# Patient Record
Sex: Male | Born: 1950 | Race: White | Hispanic: No | Marital: Married | Smoking: Never smoker
Health system: Southern US, Community
[De-identification: ages and names within clinical notes are randomized; demographics above are authoritative.]

## PROBLEM LIST (undated history)

## (undated) ENCOUNTER — Emergency Department: Discharge status: Absent without leave | Payer: Medicare Other

## (undated) DIAGNOSIS — M1990 Unspecified osteoarthritis, unspecified site: Secondary | ICD-10-CM

## (undated) DIAGNOSIS — I252 Old myocardial infarction: Secondary | ICD-10-CM

## (undated) DIAGNOSIS — I8501 Esophageal varices with bleeding: Secondary | ICD-10-CM

## (undated) DIAGNOSIS — E785 Hyperlipidemia, unspecified: Secondary | ICD-10-CM

## (undated) DIAGNOSIS — K2961 Other gastritis with bleeding: Secondary | ICD-10-CM

## (undated) DIAGNOSIS — I10 Essential (primary) hypertension: Secondary | ICD-10-CM

## (undated) DIAGNOSIS — G4730 Sleep apnea, unspecified: Secondary | ICD-10-CM

## (undated) DIAGNOSIS — S82841A Displaced bimalleolar fracture of right lower leg, initial encounter for closed fracture: Secondary | ICD-10-CM

## (undated) HISTORY — DX: Displaced bimalleolar fracture of right lower leg, initial encounter for closed fracture: S82.841A

## (undated) HISTORY — PX: JOINT REPLACEMENT: SHX530

## (undated) HISTORY — PX: CATARACT EXTRACTION: SUR2

---

## 2010-05-05 ENCOUNTER — Emergency Department (HOSPITAL_COMMUNITY): Payer: Managed Care, Other (non HMO)

## 2010-05-05 ENCOUNTER — Other Ambulatory Visit (HOSPITAL_COMMUNITY): Payer: Managed Care, Other (non HMO)

## 2010-05-05 ENCOUNTER — Inpatient Hospital Stay (HOSPITAL_COMMUNITY): Payer: Managed Care, Other (non HMO)

## 2010-05-05 ENCOUNTER — Inpatient Hospital Stay (HOSPITAL_COMMUNITY)
Admission: EM | Admit: 2010-05-05 | Discharge: 2010-05-06 | DRG: 947 | Disposition: A | Discharge status: Home / Self Care | Payer: Managed Care, Other (non HMO) | Attending: Internal Medicine | Admitting: Internal Medicine

## 2010-05-05 ENCOUNTER — Emergency Department (HOSPITAL_COMMUNITY): Payer: Self-pay

## 2010-05-05 DIAGNOSIS — G9340 Encephalopathy, unspecified: Secondary | ICD-10-CM | POA: Diagnosis present

## 2010-05-05 DIAGNOSIS — I2510 Atherosclerotic heart disease of native coronary artery without angina pectoris: Secondary | ICD-10-CM | POA: Diagnosis present

## 2010-05-05 DIAGNOSIS — M79609 Pain in unspecified limb: Secondary | ICD-10-CM

## 2010-05-05 DIAGNOSIS — M069 Rheumatoid arthritis, unspecified: Secondary | ICD-10-CM | POA: Diagnosis present

## 2010-05-05 DIAGNOSIS — E785 Hyperlipidemia, unspecified: Secondary | ICD-10-CM | POA: Diagnosis present

## 2010-05-05 DIAGNOSIS — F29 Unspecified psychosis not due to a substance or known physiological condition: Secondary | ICD-10-CM | POA: Diagnosis present

## 2010-05-05 DIAGNOSIS — E872 Acidosis, unspecified: Secondary | ICD-10-CM | POA: Diagnosis present

## 2010-05-05 DIAGNOSIS — I509 Heart failure, unspecified: Secondary | ICD-10-CM | POA: Diagnosis present

## 2010-05-05 DIAGNOSIS — Z96659 Presence of unspecified artificial knee joint: Secondary | ICD-10-CM

## 2010-05-05 DIAGNOSIS — E119 Type 2 diabetes mellitus without complications: Secondary | ICD-10-CM | POA: Diagnosis present

## 2010-05-05 DIAGNOSIS — I10 Essential (primary) hypertension: Secondary | ICD-10-CM | POA: Diagnosis present

## 2010-05-05 DIAGNOSIS — F329 Major depressive disorder, single episode, unspecified: Secondary | ICD-10-CM | POA: Diagnosis present

## 2010-05-05 DIAGNOSIS — K7460 Unspecified cirrhosis of liver: Secondary | ICD-10-CM | POA: Diagnosis present

## 2010-05-05 DIAGNOSIS — M545 Low back pain, unspecified: Secondary | ICD-10-CM | POA: Diagnosis present

## 2010-05-05 DIAGNOSIS — I252 Old myocardial infarction: Secondary | ICD-10-CM

## 2010-05-05 DIAGNOSIS — F3289 Other specified depressive episodes: Secondary | ICD-10-CM | POA: Diagnosis present

## 2010-05-05 DIAGNOSIS — R4182 Altered mental status, unspecified: Principal | ICD-10-CM | POA: Diagnosis present

## 2010-05-05 LAB — POCT I-STAT 3, ART BLOOD GAS (G3+)
pCO2 arterial: 62.6 mmHg (ref 35.0–45.0)
pH, Arterial: 7.303 — ABNORMAL LOW (ref 7.350–7.450)

## 2010-05-05 LAB — CARDIAC PANEL(CRET KIN+CKTOT+MB+TROPI)
CK, MB: 4.9 ng/mL — ABNORMAL HIGH (ref 0.3–4.0)
CK, MB: 2.7 ng/mL (ref 0.3–4.0)
Relative Index: INVALID (ref 0.0–2.5)
Total CK: 108 U/L (ref 7–232)
Total CK: 82 U/L (ref 7–232)
Troponin I: 0.01 ng/mL (ref 0.00–0.06)

## 2010-05-05 LAB — URINALYSIS, ROUTINE W REFLEX MICROSCOPIC
Hgb urine dipstick: NEGATIVE
Nitrite: NEGATIVE
Specific Gravity, Urine: 1.018 (ref 1.005–1.030)
Urobilinogen, UA: 1 mg/dL (ref 0.0–1.0)

## 2010-05-05 LAB — GLUCOSE, CAPILLARY
Glucose-Capillary: 122 mg/dL — ABNORMAL HIGH (ref 70–99)
Glucose-Capillary: 126 mg/dL — ABNORMAL HIGH (ref 70–99)
Glucose-Capillary: 138 mg/dL — ABNORMAL HIGH (ref 70–99)
Glucose-Capillary: 129 mg/dL — ABNORMAL HIGH (ref 70–99)
Glucose-Capillary: 203 mg/dL — ABNORMAL HIGH (ref 70–99)

## 2010-05-05 LAB — ETHANOL: Alcohol, Ethyl (B): <5 mg/dL (ref 0–10)

## 2010-05-05 LAB — COMPREHENSIVE METABOLIC PANEL
ALT: 39 U/L (ref 0–53)
AST: 45 U/L — ABNORMAL HIGH (ref 0–37)
Alkaline Phosphatase: 126 U/L — ABNORMAL HIGH (ref 39–117)
CO2: 25 mEq/L (ref 19–32)
Glucose, Bld: 131 mg/dL — ABNORMAL HIGH (ref 70–99)
Potassium: 3.4 mEq/L — ABNORMAL LOW (ref 3.5–5.1)
Sodium: 140 mEq/L (ref 135–145)
Total Protein: 6.5 g/dL (ref 6.0–8.3)

## 2010-05-05 LAB — IRON AND TIBC
Iron: 49 ug/dL (ref 42–135)
Saturation Ratios: 17 % — ABNORMAL LOW (ref 20–55)
TIBC: 290 ug/dL (ref 215–435)
UIBC: 241 ug/dL

## 2010-05-05 LAB — CBC
MCHC: 31.5 g/dL (ref 30.0–36.0)
RDW: 16.5 % — ABNORMAL HIGH (ref 11.5–15.5)

## 2010-05-05 LAB — LACTIC ACID, PLASMA: Lactic Acid, Venous: 2.3 mmol/L — ABNORMAL HIGH (ref 0.5–2.2)

## 2010-05-05 LAB — BRAIN NATRIURETIC PEPTIDE: Pro B Natriuretic peptide (BNP): 81 pg/mL (ref 0.0–100.0)

## 2010-05-05 LAB — RAPID URINE DRUG SCREEN, HOSP PERFORMED
Barbiturates: NOT DETECTED
Opiates: NOT DETECTED
Tetrahydrocannabinol: NOT DETECTED

## 2010-05-05 LAB — TSH: TSH: 0.8 u[IU]/mL (ref 0.350–4.500)

## 2010-05-06 ENCOUNTER — Inpatient Hospital Stay (HOSPITAL_COMMUNITY): Payer: Managed Care, Other (non HMO)

## 2010-05-06 ENCOUNTER — Other Ambulatory Visit (HOSPITAL_COMMUNITY): Payer: Managed Care, Other (non HMO)

## 2010-05-06 LAB — COMPREHENSIVE METABOLIC PANEL
BUN: 12 mg/dL (ref 6–23)
CO2: 28 mEq/L (ref 19–32)
Calcium: 8.9 mg/dL (ref 8.4–10.5)
Creatinine, Ser: 0.8 mg/dL (ref 0.4–1.5)
GFR calc non Af Amer: >60 mL/min (ref >60)
Glucose, Bld: 132 mg/dL — ABNORMAL HIGH (ref 70–99)
Total Bilirubin: 0.7 mg/dL (ref 0.3–1.2)

## 2010-05-06 LAB — CBC
MCH: 28.6 pg (ref 26.0–34.0)
MCHC: 31.7 g/dL (ref 30.0–36.0)
MCV: 90.2 fL (ref 78.0–100.0)
Platelets: 145 10*3/uL — ABNORMAL LOW (ref 150–400)
RDW: 16.8 % — ABNORMAL HIGH (ref 11.5–15.5)
WBC: 7.9 10*3/uL (ref 4.0–10.5)

## 2010-05-06 LAB — AMMONIA: Ammonia: 70 umol/L — ABNORMAL HIGH (ref 11–35)

## 2010-05-06 LAB — MAGNESIUM: Magnesium: 2 mg/dL (ref 1.5–2.5)

## 2010-05-06 LAB — GLUCOSE, CAPILLARY: Glucose-Capillary: 138 mg/dL — ABNORMAL HIGH (ref 70–99)

## 2010-05-07 NOTE — Discharge Summary (Signed)
NAMESAVIAN, Nicholas Petersen               ACCOUNT NO.:  [id number]  MEDICAL RECORD NO.:  [id number]           PATIENT TYPE:  I  LOCATION:  2108                         FACILITY:  [facility]  PHYSICIAN:  Nicholas Petersen, M.D.  DATE OF BIRTH:  11-14-1950  DATE OF ADMISSION:  05/05/2010 DATE OF DISCHARGE:  05/06/2010                              DISCHARGE SUMMARY   PRIMARY CARE PROVIDER:  [facility].  DISCHARGE DIAGNOSES: 1. Altered mental status/hallucinosis secondary to medication side     effect. 2. Fall, secondary to altered mental status/hallucinosis.  No bony     injuries. 3. Mild congestive heart failure. 4. History of non alcoholic steatohepatitis/cirrhosis. 5. Type 2 diabetes mellitus. 6. Dyslipidemia. 7. Hypertension. 8. History of coronary artery disease status post myocardial     infarction. 9. Chronic low back pain. 10.Rheumatoid arthritis. 11.Status post recent cataract surgery, right eye. 12.Mild hyperammonemia.  DISCHARGE MEDICATIONS: 1. Bromfenac ophthalmic solution 0.09% one drop right eye daily. 2. Ciprofloxacin 0.3% ophthalmic solution 1 drop right eye q.i.d. 3. Lactulose (10 g/50 mL) 30 mL p.o. b.i.d. 4. Prednisolone 1% ophthalmic solution (Pred Forte) 1 drop right eye     q.i.d. 5. Lasix 40 mg p.o. b.i.d. (40 mg p.o. daily). 6. Atenolol 100 mg p.o. daily. 7. Cymbalta 60 mg p.o. daily. 8. Enbrel (50 mg/mL) 1 mL subcutaneously weekly. 9. Flonase nasal spray 2 sprays each nostril q.p.m. 10.Hydrocodone/APAP (7.5/325) one p.o. t.i.d. 11.Integra Plus 1 capsule p.o. daily. 12.Lipitor 40 mg p.o. at bedtime. 13.Lisinopril/hydrochlorothiazide (20/12.5) 2 tablets p.o. q.a.m. 14.Lovaza 2 g p.o. b.i.d. 15.Lyrica 300 mg p.o. daily p.r.n. for pain. 16.Melatonin 10 mg p.o. p.r.n. at bedtime for insomnia. 17.Metformin 500 mg p.o. b.i.d. 18.Nuvigil 250 mg p.o. daily. 19.Protonix 40 mg p.o. daily. 20.Potassium chloride 20 mEq p.o. daily. 21.Prednisone 10 mg p.o.  daily. 22.Trilipix 135 mg p.o. daily. 23.Zyrtec 10 mg p.o. daily.  PROCEDURES: 1. Chest x-ray May 05, 2010.  This showed appearance most     compatible with moderate CHF with low lung volumes. 2. Cervical CT scan, May 05, 2010.  This showed no acute cervical     spinal fracture subluxation or dislocation, completed C5-C7 ACDF     fusion, adjacent segment disease at C4-C5. 3. Head CT scan, May 05, 2010.  This showed no acute intracranial     abnormality.  There was right-sided watershed encephalomalacia,     findings compatible with recent right cataract surgery. 4. Brain MRI, May 06, 2010.  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  No sign of acute or reversible process.  The     remainder of the brain appears normal for age. 5. EEG done May 05, 2010.  Preliminary report indicates slowing     but no focal epileptiform changes. 6. 2-D echocardiogram May 05, 2010.  This showed normal     ventricular cavity size, systolic function was mildly to moderately     reduced.  Estimated ejection fraction 40% to 45%.  Regional wall     motion was normal. 7. Bilateral  lower extremity venous Dopplers May 05, 2010.  No     evidence of DVT, superficial thrombophlebitis or Baker cyst.  CONSULTATIONS:  None.  ADMISSION HISTORY:  As in H and P notes of May 05, 2010, dictated by Dr. Midge Minium.  However, in brief, this is a 60 year old male, with known history of type 2 diabetes mellitus, chronic low back pain, DJD, depression, status post recent cataract surgery, history of nonalcoholic steatohepatitis complicated by liver cirrhosis, dyslipidemia, coronary artery disease status post previous MI approximately 20 years ago, status post knee replacements, post thyroid surgery, presenting with altered mental status described as confusion and hallucinosis.  Apparently, he went to work on  May 04, 2010, and then after his wife picked him up from work, he became progressively more confused. When he went to the bathroom, he fell in the bath tub.  EMS was then called.  The patient was evaluated in the emergency department, given several doses of Narcan with minimal response, then referred to the medical service for admission further evaluation, investigation and management.  CLINICAL COURSE: 1. Altered mental status.  Etiology appears to be multifactorial.     However, main issues appeared to pain medication side effect     occasioned by benzodiazepine as well as mild hypoammonemia and     congestive heart failure.  The patient had a urine drug screen,     which revealed benzodiazepines.  This was reversed with flumazenil,      with satisfactory clinical response and resolution of confusion     There was no recurrence of hallucinations, during the course of     the patient's inpatient observation.  Urinalysis was normal.  Chest     x-ray showed no evidence of infection.  The patient had no pyrexia     during the course of his this hospitalization.  White cell count     was within normal limits.  As of May 06, 2010, the patient was     fully oriented and mental status appeared to be at baseline.  2. History of fall.  Fortunately, the patient had no bony injuries.     He did have cervical and head CT scan, which were devoid of acute     abnormalities.  3. Type 2 diabetes mellitus.  The patient remained euglycemic     throughout the course of his hospitalization on diet and sliding-     scale insulin coverage.  4. Hypertension.  The patient remained normotensive.  5. Dyslipidemia.  The patient continues on preadmission lipid-lowering     medication.  6. Chronic low back pain.  This did not prove problematic.  7. Status post recent cataract surgery.  The patient appears stable     from this viewpoint.  He continues on his preadmission ophthalmic      solutions.  8. NASH/hepatic cirrhosis.  The patient showed no evidence of     decompensation.  He was continued on preadmission beta-blocker and     diuretic treatment.  9. Congestive heart failure.  The patient did have a chest x-ray,     which confirmed mild-to-moderate congestive heart failure and     clinically, he also had peripheral edema.  BNP was found to be     between 81-111, however, 2-D echocardiogram showed an ejection     fraction of 44-45% consistent with systolic dysfunction and no     evidence of regional wall motion abnormalities.  Cardiac enzymes     were cycled  and remained unelevated, effectively ruling out acute     coronary syndrome.  The patient was managed with intravenous     diuretics and we were subsequently able to transition him to oral     Lasix in a dose of 40 mg p.o. t.i.d. on May 06, 2010.  He     continues on preadmission ACE inhibitor therapy.  10.Hyperammonemia.  The patient's ammonia levels were found to be     mildly elevated, ranging between 42 and 70.  He was therefore     commenced on lactulose accordingly.  He had no evidence of     constructive dyspraxia or asterixis.  DISPOSITION:  The patient was on May 06, 2010, considered clinically stable for discharge.  He was therefore discharged accordingly.  ACTIVITY:  As tolerated.  Recommended to increase activity slowly.  DIET:  Heart-healthy/carbohydrate modified.  FOLLOWUP INSTRUCTIONS:  The patient is to follow up with his primary MD at [facility] within 1-2 weeks of discharge.  He is to continue to follow up per prior scheduled appointment with his ophthalmologist.     Nicholas Petersen, M.D.     CO/MEDQ  D:  05/06/2010  T:  05/07/2010  Job:  [id number]  cc:   [facility]  Electronically Signed by Nicholas Petersen M.D. on 05/07/2010 05:57:41 PM

## 2010-05-30 NOTE — H&P (Signed)
NAMEKAAMIL, MOREFIELD               ACCOUNT NO.:  [id number]  MEDICAL RECORD NO.:  [id number]           PATIENT TYPE:  E  LOCATION:  [facility]                         FACILITY:  [facility]  PHYSICIAN:  Eduard Clos, MDDATE OF BIRTH:  [birth year]  DATE OF ADMISSION:  05/05/2010 DATE OF DISCHARGE:                             HISTORY & PHYSICAL   PRIMARY CARE PHYSICIAN:  At [facility].  CHIEF COMPLAINT:  Altered mental status.  HISTORY OF PRESENT ILLNESS:  A 60 year old male with known history of diabetes mellitus type 2, chronic low-back pain, depression, recent cataract surgery 3 days ago, history of cirrhosis of liver secondary to NASH, hyperlipidemia, previous history of MI, was brought into ER, the patient was increasingly confused and becoming less responsive.  The patient as per the patient's wife who was the historian states that after the surgery for his right eye cataract, he has become more confused, but he did go for work yesterday and his wife went and dropped him and picked him up.  After picking up, while he was having supper, he said that he could see certain things and he hallucinating and progressively he got more and more confused and when he woke up to go to the bathroom he sat on the commode and he fell on the bath tub.  After that the EMS was called and the patient was brought in here.  The patient has had a CT head and neck at this time, which does not show anything acute.  The patient's drug screen is positive for benzodiazepines, which is not accounted, the patient's wife does not recall him taking any benzodiazepine.  He did receive a total of 6, of Narcan, which has given minimal response.  The patient will be admitted for further workup.  Clinically Dr. Sung Amabile was called and Dr. Sung Amabile was requested for admission.  PAST MEDICAL HISTORY: 1. Diabetes mellitus type 2. 2. Hypertension. 3. Chronic low-back pain, has received injections for low  back pain     just a week ago. 4. Hyperlipidemia. 5. History of MI 20 years ago.  He did not have any procedures done.  PAST SURGICAL HISTORY:  Knee replacement, thyroid surgery, recent right cataract eye surgery 3 days ago.  Medications prior to admission which has to be verified include metformin, atenolol, Cymbalta, Enbrel for rheumatoid arthritis, Lovaza, Vicodin, and Flexeril.  ALLERGIES:  ASPIRIN and CLONIDINE.  SOCIAL HISTORY:  The patient does not smoke cigarette, drink alcohol, or use illegal drugs.  Married, lives with his wife.  He is a full code.  REVIEW OF SYSTEMS:  As per history of present illness, nothing else significant.  PHYSICAL EXAMINATION:  GENERAL:  The patient examined at bedside not in acute distress. VITAL SIGNS:  Blood pressure 150/75, pulse 80 per minute, temperature 97.3, respirations 18, O2 sat 100%. HEENT:  There is patch on the right eye from the recent surgery.  Pupils are reacting to light.  There is no facial asymmetry. NECK:  No neck rigidity. CHEST:  Bilateral air entry present.  No rhonchi, no crepitation. HEART:  S1 and S2 is heard. ABDOMEN:  Soft,  nontender.  Bowel sounds heard. CNS:  The patient is drowsy, arousable.  He does follow commands on repeated requests.  He moved his upper and lower extremities. EXTREMITIES:  There is 1 to 2+ edema extending up to his thigh, which the patient's wife stated is chronic.  LABORATORY DATA:  EKG shows normal sinus rhythm with heart rate around 79 beats per minute with nonspecific ST-T changes.  CT head and CT spine without contrast shows no acute intracranial abnormality, right-sided watershed encephalomalacia, findings compatible with recent right cataract surgery and no acute cervical spine fractures.  Completed C5-C7 ACDF fusion adjacent segment disease at C4-C5.  Chest x-ray shows appearance is most compatible with moderate CHF with low lung volumes. CBC; WBC is 8.6, hemoglobin is 11.2,  hematocrit is 35.5, platelets 154. ABG shows pH of 7.3, pCO2 of 62.6, pO2 is 416, oxygen saturation is 100%.  Complete metabolic panel; sodium 140, potassium 3.4, chloride 104, carbon dioxide 25, glucose 131, anion gap is 11, BUN 18, creatinine 0.6, total bilirubin is 0.5, alk phos is 126, AST 45, ALT 39, total protein 6.5, albumin 3.4, calcium 8.8, acetaminophen level less than 10. Drug screen is positive for benzodiazepine.  UA is negative.  Alcohol level is less than 5.  ASSESSMENT: 1. Altered mental status most likely secondary to medications.  Could     be either medication induced or probably possibility of medication     withdrawal. 2. Respiratory acidosis. 3. Diabetes mellitus type 2. 4. Recent right eye cataract surgery. 5. History of cirrhosis of liver secondary to NASH. 6. Chronic low-back pain. 7. History of rheumatoid arthritis. 8. Hyperlipidemia. 9. Previous history of myocardial infarction. 10.Possible fluid overload with diastolic congestive heart failure.  PLAN: 1. At this time, I am going to admit the patient to ICU. 2. At this time for his altered mental status which could be     medication induced, he is positive for benzodiazepine, we do not     know exactly the source for his benzodiazepine or if it is a     reaction to Flexeril.  We are going to observe in the ICU and watch     out for any seizures.  We will get an EEG and MRI of the brain.  At     this time, the patient also will be checked for ammonia level as     the patient has history of cirrhosis secondary to NASH.  We are     also going to in addition check lactic acid levels along with a BNP     and cardiac enzymes. 3. Mild respiratory acidosis.  At this time, we will observe him, but     if the patient becomes more labored or if saturation drops at that     time we will consult. 4. Diabetes mellitus type 2.  At this time, we will check q.4 ABGs     with sensitive sliding scale if needed. 5.  Hypertension.  We will place the patient on p.o. labetalol. 6. Further recommendation based on the test order and clinical course. 7. The patient is a full code. 8. I have discussed in detail about the patient's condition with Dr.     Sung Amabile, Critical Care who was also going to watch the patient.     Eduard Clos, MD     ANK/MEDQ  D:  05/05/2010  T:  05/05/2010  Job:  [id number]  Electronically Signed by Midge Minium MD on 05/30/2010 04:43:04 PM

## 2011-02-16 ENCOUNTER — Emergency Department (HOSPITAL_BASED_OUTPATIENT_CLINIC_OR_DEPARTMENT_OTHER)
Admission: EM | Admit: 2011-02-16 | Discharge: 2011-02-17 | Disposition: A | Discharge status: Home / Self Care | Payer: Self-pay | Attending: Emergency Medicine | Admitting: Emergency Medicine

## 2011-02-16 ENCOUNTER — Encounter: Payer: Self-pay | Admitting: *Deleted

## 2011-02-16 DIAGNOSIS — I252 Old myocardial infarction: Secondary | ICD-10-CM | POA: Insufficient documentation

## 2011-02-16 DIAGNOSIS — M2550 Pain in unspecified joint: Secondary | ICD-10-CM | POA: Insufficient documentation

## 2011-02-16 DIAGNOSIS — I10 Essential (primary) hypertension: Secondary | ICD-10-CM | POA: Insufficient documentation

## 2011-02-16 DIAGNOSIS — E785 Hyperlipidemia, unspecified: Secondary | ICD-10-CM | POA: Insufficient documentation

## 2011-02-16 DIAGNOSIS — G4730 Sleep apnea, unspecified: Secondary | ICD-10-CM | POA: Insufficient documentation

## 2011-02-16 DIAGNOSIS — M069 Rheumatoid arthritis, unspecified: Secondary | ICD-10-CM | POA: Insufficient documentation

## 2011-02-16 HISTORY — DX: Hyperlipidemia, unspecified: E78.5

## 2011-02-16 HISTORY — DX: Essential (primary) hypertension: I10

## 2011-02-16 HISTORY — DX: Sleep apnea, unspecified: G47.30

## 2011-02-16 HISTORY — DX: Unspecified osteoarthritis, unspecified site: M19.90

## 2011-02-16 HISTORY — DX: Old myocardial infarction: I25.2

## 2011-02-16 NOTE — ED Notes (Signed)
Pt c/o left lower leg pain and redness with increased generalized stiffness

## 2011-02-16 NOTE — ED Provider Notes (Signed)
History     CSN: [id number] Arrival date & time: 02/16/2011 11:33 PM   First MD Initiated Contact with Patient 02/16/11 2352      Chief Complaint  Patient presents with  . Leg Pain    (Consider location/radiation/quality/duration/timing/severity/associated sxs/prior treatment) HPI 60 year old male presents emergency department with complaint of worsening pain in his left foot and ankle as well as redness starting today. Patient also complaining of pain in both wrists, both elbows, and both knees. Patient with history of rheumatoid arthritis. He is on Enbrel. He is followed by Dr. Vassie Moselle with rheumatology. Patient denies any fevers.  The patient is also a diabetic with history of hypertension. Patient reports he's had similar flares the past and normally goes on prednisone for same. He has run out of his prednisone prescription.   His wife was concerned about cellulitis or infection given the redness and warmth to the left ankle. Patient reports he normally takes Norco for pain and has not taken the last 2 days Past Medical History  Diagnosis Date  . Arthritis   . Diabetes mellitus   . Hypertension   . MI, old   . Hyperlipidemia   . Sleep apnea   . Narcolepsy     Past Surgical History  Procedure Date  . Cataract extraction   . Joint replacement     History reviewed. No pertinent family history.  History  Substance Use Topics  . Smoking status: Never Smoker   . Smokeless tobacco: Not on file  . Alcohol Use: No      Review of Systems  All other systems reviewed and are negative.    Allergies  Review of patient's allergies indicates not on file.  Home Medications  No current outpatient prescriptions on file.  BP 138/74  Pulse 89  Temp 98 F (36.7 C)  Resp 18  Ht 6' (1.829 m)  Wt 261 lb (118.389 kg)  BMI 35.40 kg/m2  SpO2 96%  Physical Exam  Nursing note and vitals reviewed. Constitutional: He is oriented to person, place, and time. He appears  well-developed and well-nourished.  HENT:  Head: Normocephalic and atraumatic.  Right Ear: External ear normal.  Left Ear: External ear normal.  Nose: Nose normal.  Mouth/Throat: Oropharynx is clear and moist.  Eyes: Conjunctivae and EOM are normal. Pupils are equal, round, and reactive to light.  Neck: Normal range of motion. Neck supple. No JVD present. No tracheal deviation present. No thyromegaly present.  Cardiovascular: Normal rate, regular rhythm, normal heart sounds and intact distal pulses.  Exam reveals no gallop and no friction rub.   No murmur heard. Pulmonary/Chest: Effort normal and breath sounds normal. No stridor. No respiratory distress. He has no wheezes. He has no rales. He exhibits no tenderness.  Abdominal: Soft. Bowel sounds are normal. He exhibits no distension and no mass. There is no tenderness. There is no rebound and no guarding.  Musculoskeletal: He exhibits tenderness. He exhibits no edema.       Patient with diffuse tenderness and warmth to bilateral wrists, knees, ankles. Some erythema noted to the left anterior ankle with increased warmth to this area. No skin breakdown or overt signs of cellulitis. Patient with decreased range of motion at all joints secondary to pain and underlying arthritis  Lymphadenopathy:    He has no cervical adenopathy.  Neurological: He is oriented to person, place, and time. He has normal reflexes. No cranial nerve deficit. He exhibits normal muscle tone. Coordination normal.  Skin: Skin is  dry. No rash noted. No erythema. No pallor.  Psychiatric: He has a normal mood and affect. His behavior is normal. Judgment and thought content normal.    ED Course  Procedures (including critical care time)  Labs Reviewed  CBC - Abnormal; Notable for the following:    RBC 3.85 (*)    Hemoglobin 9.7 (*)    HCT 30.7 (*)    MCH 25.2 (*)    RDW 17.7 (*)    Platelets 143 (*)    All other components within normal limits  SEDIMENTATION RATE -  Abnormal; Notable for the following:    Sed Rate 51 (*)    All other components within normal limits  DIFFERENTIAL  CULTURE, BLOOD (ROUTINE X 2)  CULTURE, BLOOD (ROUTINE X 2)   No results found.   1. Large joint arthralgia of multiple sites   2. Rheumatoid arthritis       MDM  31-year-old male with rheumatoid arthritis with worsening pain and slight redness to left ankle as well as polyarthralgias. CBC without elevation, blood cultures pending sed rate elevated but not abnormally so given history of rheumatoid arthritis. Patient overall looks well not septic appearing. Feel that this is more A. RA flare rather than a septic joint or cellulitis. Will start patient on prednisone taper. This was discussed with the patient and he knows to watch out for elevated sugars fevers worsening redness or swelling for which she is to return to the emergency room. Patient is to call his rheumatologist and follow for recheck. Patient also noted to have worsening anemia, to restart his iron and followup with primary care Dr. for same        Olivia Mackie, MD 02/17/11 [phone]

## 2011-02-17 LAB — CBC
Hemoglobin: 9.7 g/dL — ABNORMAL LOW (ref 13.0–17.0)
MCH: 25.2 pg — ABNORMAL LOW (ref 26.0–34.0)
MCHC: 31.6 g/dL (ref 30.0–36.0)
MCV: 79.7 fL (ref 78.0–100.0)
Platelets: 143 10*3/uL — ABNORMAL LOW (ref 150–400)

## 2011-02-17 LAB — DIFFERENTIAL
Basophils Relative: 0 % (ref 0–1)
Eosinophils Absolute: 0.2 10*3/uL (ref 0.0–0.7)
Eosinophils Relative: 3 % (ref 0–5)
Monocytes Relative: 11 % (ref 3–12)
Neutrophils Relative %: 69 % (ref 43–77)

## 2011-02-17 MED ORDER — PREDNISONE 20 MG PO TABS
40.0000 mg | ORAL_TABLET | Freq: Once | ORAL | Status: AC
  Administered 2011-02-17: 40 mg via ORAL
  Filled 2011-02-17: qty 2

## 2011-02-17 MED ORDER — OXYCODONE-ACETAMINOPHEN 5-325 MG PO TABS
2.0000 | ORAL_TABLET | Freq: Once | ORAL | Status: AC
  Administered 2011-02-17: 2 via ORAL
  Filled 2011-02-17: qty 2

## 2011-02-17 MED ORDER — PREDNISONE 10 MG PO TABS: 10.0000 mg | ORAL_TABLET | Freq: Every day | ORAL | Status: AC

## 2011-02-23 LAB — CULTURE, BLOOD (ROUTINE X 2)
Culture  Setup Time: 201211160629
Culture: NO GROWTH

## 2011-09-03 IMAGING — CT CT HEAD W/O CM
4 of 6 series · 16 of 47 positions shown, 18 images · non-contrast
Comparison: None.

CT HEAD

CLINICAL DATA: Altered level of consciousness.  Medication
reaction.  History of depression.  History of diabetes.  Laceration
to head.  Confusion.  Altered mental status.

CT HEAD WITHOUT CONTRAST
CT CERVICAL SPINE WITHOUT CONTRAST
TECHNIQUE: Multidetector CT imaging of the head and cervical spine
was performed following the standard protocol without intravenous
contrast.  Multiplanar CT image reconstructions of the cervical
spine were also generated.

[Series 3: recon 2: brain · axial · 0.47mm/px · z∈[-109,-0]mm · 5 of 64 slices shown, 7 images]
[im 11/64  brain]
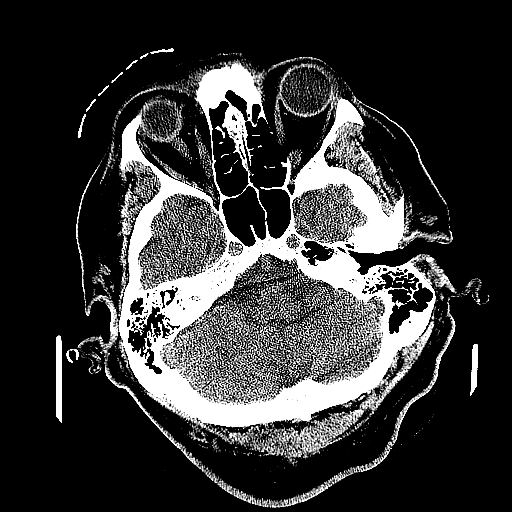
[im 11/64  bone]
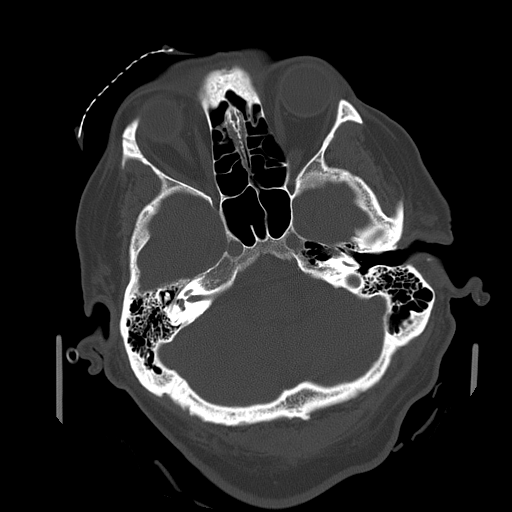
[im 22/64  brain]
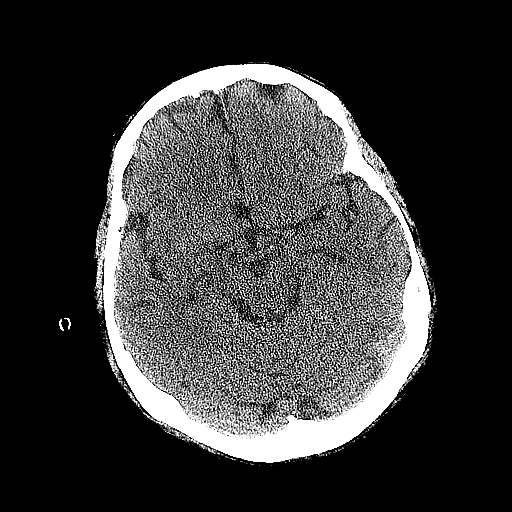
[im 32/64  brain]
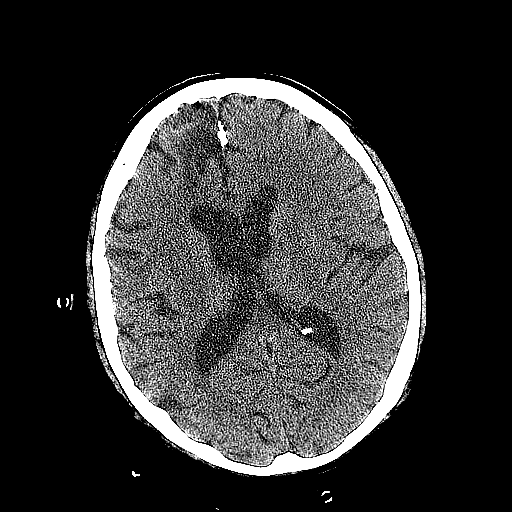
[im 43/64  brain]
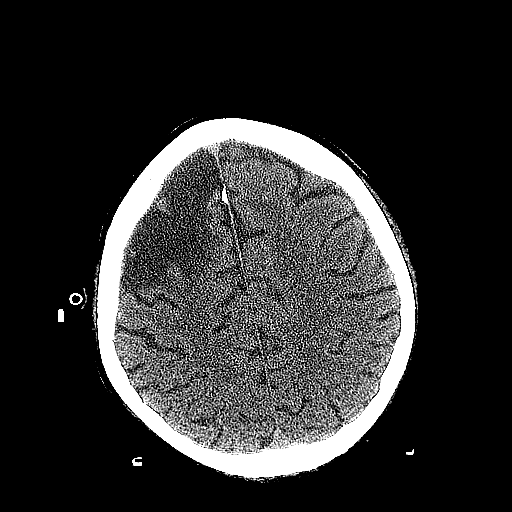
[im 53/64  brain]
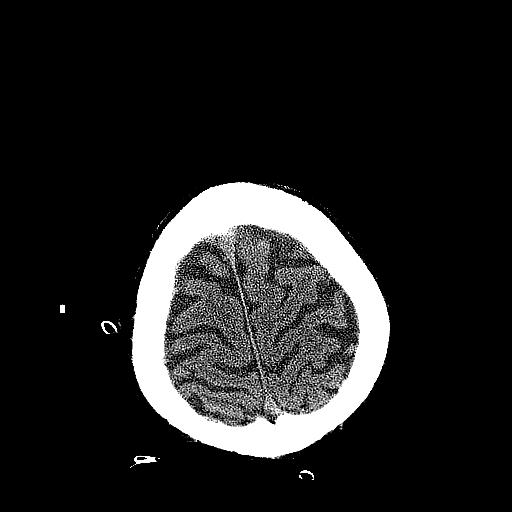
[im 53/64  bone]
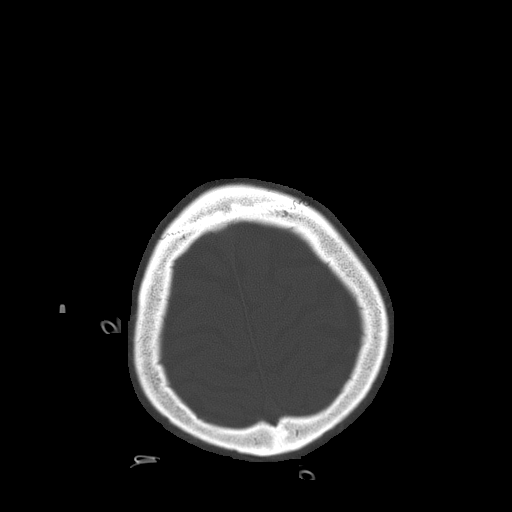

[Series 4: cervical spine · axial · 0.33mm/px · z∈[-344,-244]mm · 5 of 90 slices shown]
[im 10/90  brain]
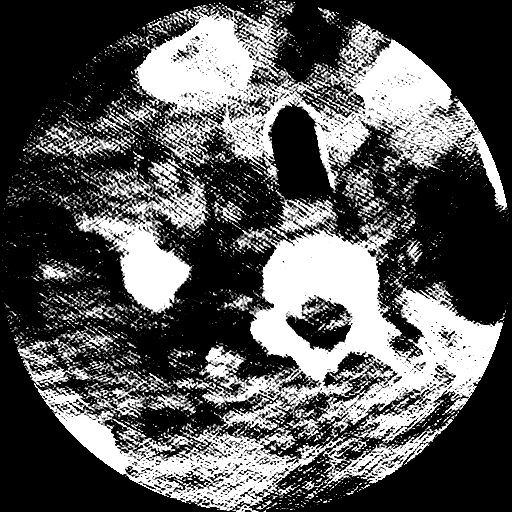
[im 20/90  brain]
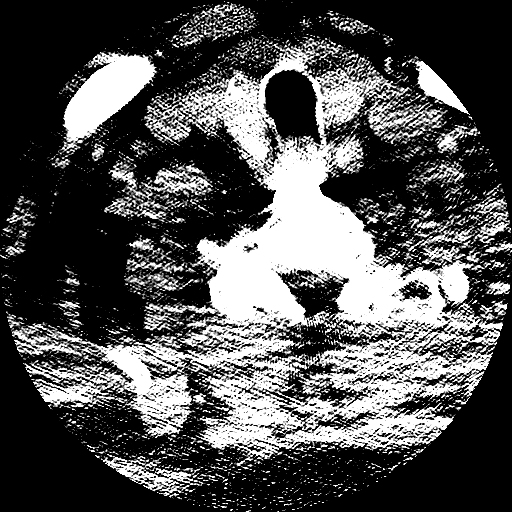
[im 30/90  brain]
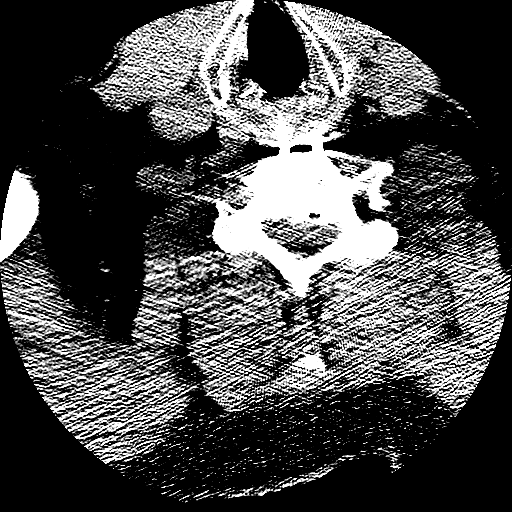
[im 40/90  brain]
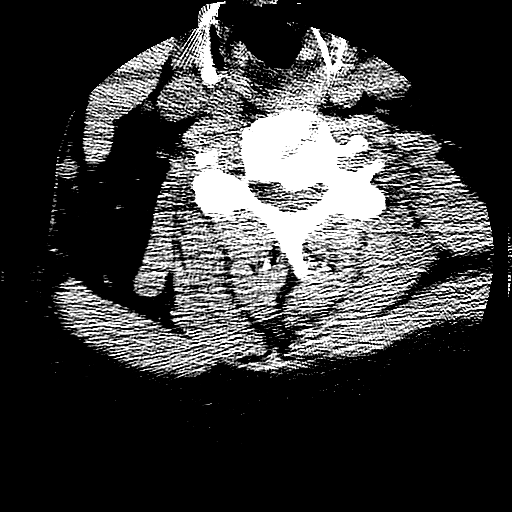
[im 50/90  brain]
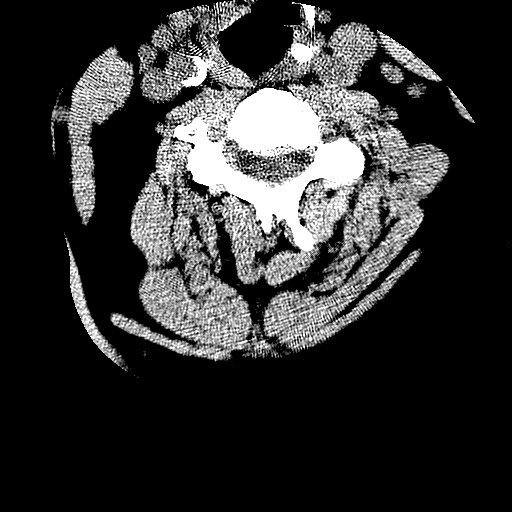

[Series 600: sag · sagittal · 0.45mm/px · 3 of 63 slices shown]
[im 40/63  brain]
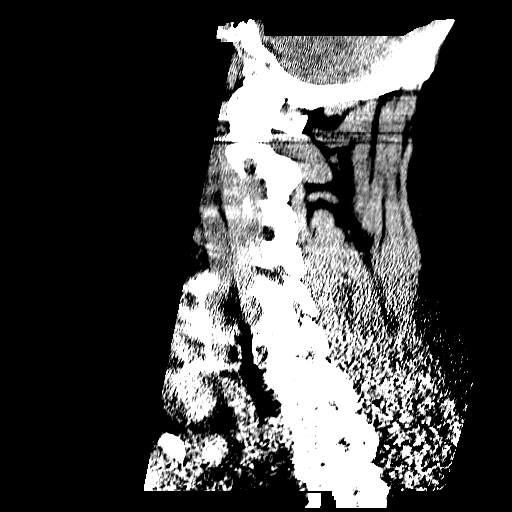
[im 46/63  brain]
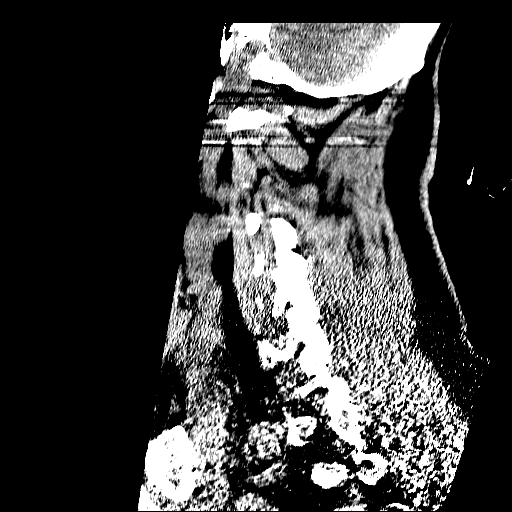
[im 51/63  brain]
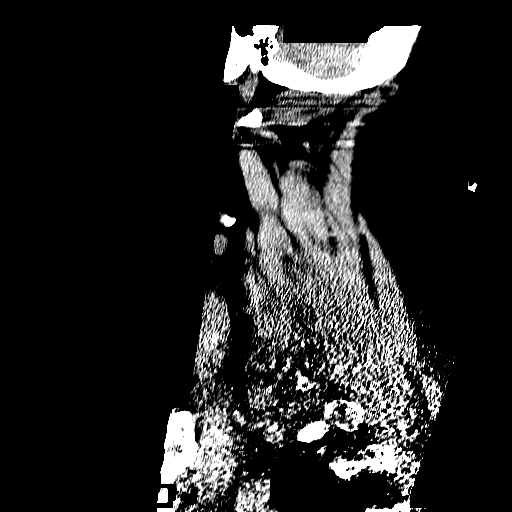

[Series 601: cor · coronal · 0.45mm/px · 3 of 57 slices shown]
[im 19/57  brain]
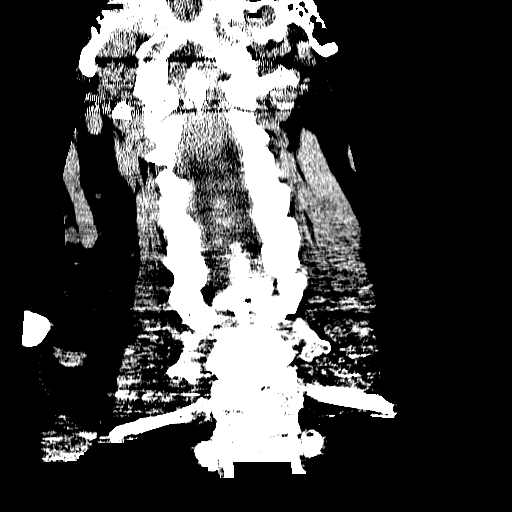
[im 25/57  brain]
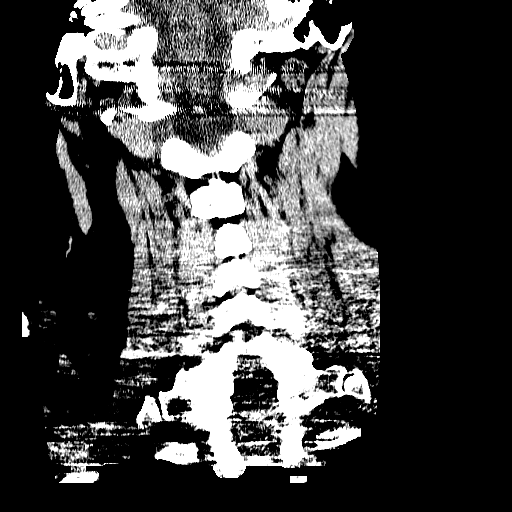
[im 32/57  brain]
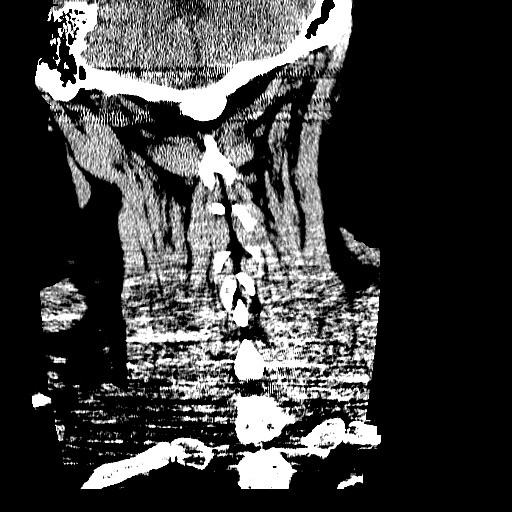

[16 of 47 positions shown; findings below may reference images not displayed]

FINDINGS: There is encephalomalacia and a watershed distribution
involving the right frontal lobe and right posterior frontal /
parietal region.  This is compatible with remote infarct, which is
confirmed by history and discussion with Dr. Guru.  There is
no acute abnormality.  No mass lesion, mass effect, midline shift,
hydrocephalus, or hemorrhage.  The brainstem and pons appears
within normal limits.  No skull fracture is identified.  Shield is
present over the right high.  Right lens extraction.  Mastoid air
cells and paranasal sinuses appear within normal limits.
IMPRESSION: 1.  No acute intracranial abnormality.
2.  Right-sided watershed encephalomalacia.
3.  Findings compatible with recent right cataract surgery.

CT CERVICAL SPINE
FINDINGS: The patient is tilted to the right in the scanner.  There
is an ACDF from C5-C7 with completed fusion.  There is artifact
from overlapping density at C7-T2.  Notably, this C5 ACDF screws
protrude into the disc space which is now fused.  The adjacent
segment disease is present at C4-C5, with disc osteophyte complex
and uncovertebral spurring.  There is no acute cervical spine
fracture, subluxation, or dislocation.

Atlantodental degenerative disease is present.  Multilevel facet
arthrosis is present in the and fused segments.
IMPRESSION: No acute cervical spine fracture, subluxation, or dislocation.
Completed C5-C7 ACDF fusion.  Adjacent segment disease at C4-C5.

## 2012-06-12 ENCOUNTER — Encounter (HOSPITAL_BASED_OUTPATIENT_CLINIC_OR_DEPARTMENT_OTHER): Payer: Self-pay | Admitting: *Deleted

## 2012-06-12 ENCOUNTER — Inpatient Hospital Stay (HOSPITAL_BASED_OUTPATIENT_CLINIC_OR_DEPARTMENT_OTHER)
Admission: EM | Admit: 2012-06-12 | Discharge: 2012-06-15 | DRG: 377 | Disposition: A | Discharge status: Home / Self Care | Payer: Managed Care, Other (non HMO) | Attending: Internal Medicine | Admitting: Internal Medicine

## 2012-06-12 DIAGNOSIS — E785 Hyperlipidemia, unspecified: Secondary | ICD-10-CM | POA: Diagnosis present

## 2012-06-12 DIAGNOSIS — R188 Other ascites: Secondary | ICD-10-CM | POA: Diagnosis present

## 2012-06-12 DIAGNOSIS — K920 Hematemesis: Secondary | ICD-10-CM

## 2012-06-12 DIAGNOSIS — K922 Gastrointestinal hemorrhage, unspecified: Secondary | ICD-10-CM

## 2012-06-12 DIAGNOSIS — D6959 Other secondary thrombocytopenia: Secondary | ICD-10-CM | POA: Diagnosis present

## 2012-06-12 DIAGNOSIS — E1142 Type 2 diabetes mellitus with diabetic polyneuropathy: Secondary | ICD-10-CM | POA: Diagnosis present

## 2012-06-12 DIAGNOSIS — I8501 Esophageal varices with bleeding: Secondary | ICD-10-CM

## 2012-06-12 DIAGNOSIS — K7290 Hepatic failure, unspecified without coma: Secondary | ICD-10-CM | POA: Diagnosis present

## 2012-06-12 DIAGNOSIS — K2961 Other gastritis with bleeding: Principal | ICD-10-CM

## 2012-06-12 DIAGNOSIS — K7689 Other specified diseases of liver: Secondary | ICD-10-CM | POA: Diagnosis present

## 2012-06-12 DIAGNOSIS — G47419 Narcolepsy without cataplexy: Secondary | ICD-10-CM | POA: Diagnosis present

## 2012-06-12 DIAGNOSIS — K7682 Hepatic encephalopathy: Secondary | ICD-10-CM

## 2012-06-12 DIAGNOSIS — K7581 Nonalcoholic steatohepatitis (NASH): Secondary | ICD-10-CM

## 2012-06-12 DIAGNOSIS — K921 Melena: Secondary | ICD-10-CM

## 2012-06-12 DIAGNOSIS — I498 Other specified cardiac arrhythmias: Secondary | ICD-10-CM | POA: Diagnosis not present

## 2012-06-12 DIAGNOSIS — I9589 Other hypotension: Secondary | ICD-10-CM | POA: Diagnosis not present

## 2012-06-12 DIAGNOSIS — M129 Arthropathy, unspecified: Secondary | ICD-10-CM | POA: Diagnosis present

## 2012-06-12 DIAGNOSIS — I252 Old myocardial infarction: Secondary | ICD-10-CM

## 2012-06-12 DIAGNOSIS — Z79899 Other long term (current) drug therapy: Secondary | ICD-10-CM

## 2012-06-12 DIAGNOSIS — E119 Type 2 diabetes mellitus without complications: Secondary | ICD-10-CM

## 2012-06-12 DIAGNOSIS — I10 Essential (primary) hypertension: Secondary | ICD-10-CM

## 2012-06-12 DIAGNOSIS — I8511 Secondary esophageal varices with bleeding: Secondary | ICD-10-CM | POA: Diagnosis present

## 2012-06-12 DIAGNOSIS — D696 Thrombocytopenia, unspecified: Secondary | ICD-10-CM

## 2012-06-12 DIAGNOSIS — G4733 Obstructive sleep apnea (adult) (pediatric): Secondary | ICD-10-CM

## 2012-06-12 DIAGNOSIS — D62 Acute posthemorrhagic anemia: Secondary | ICD-10-CM

## 2012-06-12 DIAGNOSIS — E1149 Type 2 diabetes mellitus with other diabetic neurological complication: Secondary | ICD-10-CM | POA: Diagnosis present

## 2012-06-12 DIAGNOSIS — K7460 Unspecified cirrhosis of liver: Secondary | ICD-10-CM

## 2012-06-12 DIAGNOSIS — E860 Dehydration: Secondary | ICD-10-CM

## 2012-06-12 HISTORY — DX: Other gastritis with bleeding: K29.61

## 2012-06-12 HISTORY — DX: Esophageal varices with bleeding: I85.01

## 2012-06-12 LAB — CBC WITH DIFFERENTIAL/PLATELET
Basophils Relative: 0 % (ref 0–1)
Eosinophils Relative: 42 % — ABNORMAL HIGH (ref 0–5)
HCT: 26.6 % — ABNORMAL LOW (ref 39.0–52.0)
Lymphs Abs: 2.6 10*3/uL (ref 0.7–4.0)
MCH: 29 pg (ref 26.0–34.0)
MCV: 87.8 fL (ref 78.0–100.0)
Monocytes Absolute: 0.6 10*3/uL (ref 0.1–1.0)
Monocytes Relative: 4 % (ref 3–12)
Neutro Abs: 5.1 10*3/uL (ref 1.7–7.7)
RBC: 3.03 MIL/uL — ABNORMAL LOW (ref 4.22–5.81)
WBC: 14.2 10*3/uL — ABNORMAL HIGH (ref 4.0–10.5)

## 2012-06-12 LAB — LIPASE, BLOOD: Lipase: 65 U/L — ABNORMAL HIGH (ref 11–59)

## 2012-06-12 LAB — COMPREHENSIVE METABOLIC PANEL
ALT: 10 U/L (ref 0–53)
BUN: 40 mg/dL — ABNORMAL HIGH (ref 6–23)
CO2: 27 mEq/L (ref 19–32)
Calcium: 8.2 mg/dL — ABNORMAL LOW (ref 8.4–10.5)
Creatinine, Ser: 0.6 mg/dL (ref 0.50–1.35)
GFR calc Af Amer: >90 mL/min (ref >90)
GFR calc non Af Amer: >90 mL/min (ref >90)
Glucose, Bld: 128 mg/dL — ABNORMAL HIGH (ref 70–99)
Total Protein: 6.1 g/dL (ref 6.0–8.3)

## 2012-06-12 LAB — MRSA PCR SCREENING: MRSA by PCR: POSITIVE — AB

## 2012-06-12 LAB — PROTIME-INR: Prothrombin Time: 17 seconds — ABNORMAL HIGH (ref 11.6–15.2)

## 2012-06-12 LAB — OCCULT BLOOD X 1 CARD TO LAB, STOOL: Fecal Occult Bld: POSITIVE — AB

## 2012-06-12 LAB — LACTIC ACID, PLASMA: Lactic Acid, Venous: 2.4 mmol/L — ABNORMAL HIGH (ref 0.5–2.2)

## 2012-06-12 MED ORDER — OCTREOTIDE LOAD VIA INFUSION
50.0000 ug | Freq: Once | INTRAVENOUS | Status: AC
  Administered 2012-06-13: 50 ug via INTRAVENOUS
  Filled 2012-06-12: qty 25

## 2012-06-12 MED ORDER — MUPIROCIN 2 % EX OINT
1.0000 "application " | TOPICAL_OINTMENT | Freq: Two times a day (BID) | CUTANEOUS | Status: DC
  Administered 2012-06-13 – 2012-06-15 (×6): 1 via NASAL
  Filled 2012-06-12: qty 22

## 2012-06-12 MED ORDER — SODIUM CHLORIDE 0.9 % IV SOLN
8.0000 mg/h | INTRAVENOUS | Status: DC
  Administered 2012-06-12 – 2012-06-13 (×3): 8 mg/h via INTRAVENOUS
  Filled 2012-06-12 (×6): qty 80

## 2012-06-12 MED ORDER — MORPHINE SULFATE 4 MG/ML IJ SOLN: 4.0000 mg | Freq: Once | INTRAMUSCULAR | Status: DC

## 2012-06-12 MED ORDER — DEXTROSE 5 % IV SOLN
1.0000 g | Freq: Every day | INTRAVENOUS | Status: DC
  Administered 2012-06-13 – 2012-06-14 (×3): 1 g via INTRAVENOUS
  Filled 2012-06-12 (×4): qty 10

## 2012-06-12 MED ORDER — ONDANSETRON HCL 4 MG/2ML IJ SOLN: 4.0000 mg | Freq: Three times a day (TID) | INTRAMUSCULAR | Status: AC | PRN

## 2012-06-12 MED ORDER — SODIUM CHLORIDE 0.9 % IV SOLN
1000.0000 mL | Freq: Once | INTRAVENOUS | Status: AC
  Administered 2012-06-12: 1000 mL via INTRAVENOUS

## 2012-06-12 MED ORDER — HYDROMORPHONE HCL PF 1 MG/ML IJ SOLN
1.0000 mg | INTRAMUSCULAR | Status: AC | PRN
  Administered 2012-06-12: 1 mg via INTRAVENOUS
  Filled 2012-06-12: qty 1

## 2012-06-12 MED ORDER — ONDANSETRON HCL 4 MG PO TABS: 4.0000 mg | ORAL_TABLET | Freq: Four times a day (QID) | ORAL | Status: DC | PRN

## 2012-06-12 MED ORDER — CHLORHEXIDINE GLUCONATE CLOTH 2 % EX PADS
6.0000 | MEDICATED_PAD | Freq: Every day | CUTANEOUS | Status: DC
  Administered 2012-06-13 – 2012-06-15 (×3): 6 via TOPICAL

## 2012-06-12 MED ORDER — SODIUM CHLORIDE 0.9 % IV BOLUS (SEPSIS)
1000.0000 mL | Freq: Once | INTRAVENOUS | Status: AC
  Administered 2012-06-12: 1000 mL via INTRAVENOUS

## 2012-06-12 MED ORDER — ONDANSETRON HCL 4 MG/2ML IJ SOLN
4.0000 mg | Freq: Once | INTRAMUSCULAR | Status: AC
  Administered 2012-06-12: 4 mg via INTRAVENOUS

## 2012-06-12 MED ORDER — SODIUM CHLORIDE 0.9 % IV SOLN
INTRAVENOUS | Status: AC
  Administered 2012-06-12: 19:00:00 via INTRAVENOUS

## 2012-06-12 MED ORDER — SODIUM CHLORIDE 0.9 % IV SOLN
INTRAVENOUS | Status: DC
  Administered 2012-06-13: via INTRAVENOUS

## 2012-06-12 MED ORDER — ONDANSETRON HCL 4 MG/2ML IJ SOLN
4.0000 mg | Freq: Four times a day (QID) | INTRAMUSCULAR | Status: DC | PRN
  Administered 2012-06-13: 4 mg via INTRAVENOUS

## 2012-06-12 MED ORDER — SODIUM CHLORIDE 0.9 % IV SOLN: 1000.0000 mL | INTRAVENOUS | Status: DC

## 2012-06-12 MED ORDER — MORPHINE SULFATE 4 MG/ML IJ SOLN
4.0000 mg | Freq: Once | INTRAMUSCULAR | Status: AC
  Administered 2012-06-12: 4 mg via INTRAVENOUS
  Filled 2012-06-12: qty 1

## 2012-06-12 MED ORDER — VITAMIN K1 10 MG/ML IJ SOLN
10.0000 mg | Freq: Once | INTRAVENOUS | Status: AC
  Administered 2012-06-13: 10 mg via INTRAVENOUS
  Filled 2012-06-12: qty 1

## 2012-06-12 MED ORDER — PANTOPRAZOLE SODIUM 40 MG IV SOLR
40.0000 mg | Freq: Once | INTRAVENOUS | Status: AC
  Administered 2012-06-12: 40 mg via INTRAVENOUS
  Filled 2012-06-12: qty 40

## 2012-06-12 MED ORDER — SODIUM CHLORIDE 0.9 % IV SOLN
8.0000 mg/h | INTRAVENOUS | Status: DC
  Administered 2012-06-14: 8 mg/h via INTRAVENOUS
  Filled 2012-06-12 (×6): qty 80

## 2012-06-12 MED ORDER — ONDANSETRON HCL 4 MG/2ML IJ SOLN
4.0000 mg | Freq: Once | INTRAMUSCULAR | Status: AC
  Administered 2012-06-12: 4 mg via INTRAVENOUS
  Filled 2012-06-12: qty 2

## 2012-06-12 MED ORDER — SODIUM CHLORIDE 0.9 % IV SOLN
50.0000 ug/h | INTRAVENOUS | Status: DC
  Filled 2012-06-12: qty 1

## 2012-06-12 MED ORDER — ONDANSETRON HCL 4 MG/2ML IJ SOLN
4.0000 mg | Freq: Once | INTRAMUSCULAR | Status: DC
  Filled 2012-06-12 (×2): qty 2

## 2012-06-12 MED ORDER — SODIUM CHLORIDE 0.9 % IV SOLN
25.0000 ug/h | INTRAVENOUS | Status: DC
  Administered 2012-06-13: 25 ug/h via INTRAVENOUS
  Administered 2012-06-13: 12.5 ug/h via INTRAVENOUS
  Administered 2012-06-14: 25 ug/h via INTRAVENOUS
  Filled 2012-06-12 (×8): qty 1

## 2012-06-12 NOTE — ED Notes (Signed)
[facility] RN reports BP is too low for morphine-will admn zofran and given pain med en route prn and if BP dependent

## 2012-06-12 NOTE — ED Notes (Signed)
Pt cont'd A/O-no change in mental status-EDP notified and of current VS with 2nd L NS infusing

## 2012-06-12 NOTE — ED Notes (Signed)
Pt requested pain and nausea med prior to transfer-EDP notified-orders received

## 2012-06-12 NOTE — H&P (Addendum)
Chief Complaint:  Vomiting blood  HPI: 62 yo male h/o nash cirrhosis of liver, RA, htn, dm started with loose dark stools yesterday.  Had several episodes overnight of black loose stools then today had several episodes of vomit with bright red blood, came to [facility].  transferrred here for ugib.  Pt has not had any vomiting in several hours.  Still some loose stool but less frequent.  No h/o esophageal bleeding in past.  No abd pain.  No swelling.  No fevers.  No cp.  No etoh use.  He gets all of his gi care at [facility].  He is abble to provide his history to me and is pale but hemodynamically stable in the stepdown unit.  Review of Systems:  Positive and negative as per HPI otherwise all other systems are negative  Past Medical History: Past Medical History  Diagnosis Date  . Arthritis   . Diabetes mellitus   . Hypertension   . MI, old   . Hyperlipidemia   . Sleep apnea   . Narcolepsy    Past Surgical History  Procedure Laterality Date  . Cataract extraction    . Joint replacement      Medications: Prior to Admission medications   Medication Sig Start Date End Date Taking? Authorizing Provider  atenolol (TENORMIN) 50 MG tablet Take 50 mg by mouth daily.   Yes Historical Provider, MD  citalopram (CELEXA) 10 MG tablet Take 5-10 mg by mouth daily.    Yes Historical Provider, MD  diphenhydrAMINE (BENADRYL) 50 MG tablet Take 50 mg by mouth at bedtime as needed for itching.   Yes Historical Provider, MD  fluticasone (FLONASE) 50 MCG/ACT nasal spray Place 2 sprays into the nose at bedtime and may repeat dose one time if needed.   Yes Historical Provider, MD  furosemide (LASIX) 80 MG tablet Take 40 mg by mouth daily.   Yes Historical Provider, MD  lactulose (CHRONULAC) 10 GM/15ML solution Take 7.5 g by mouth 2 (two) times daily.   Yes Historical Provider, MD  modafinil (PROVIGIL) 200 MG tablet Take 200 mg by mouth 2 (two) times daily with breakfast and lunch.   Yes Historical  Provider, MD  oxycodone (OXY-IR) 5 MG capsule Take 5 mg by mouth 4 (four) times daily - after meals and at bedtime.    Yes Historical Provider, MD  pregabalin (LYRICA) 100 MG capsule Take 100 mg by mouth at bedtime.   Yes Historical Provider, MD  rivastigmine (EXELON) 9.5 mg/24hr Place 1 patch onto the skin every morning.   Yes Historical Provider, MD  sodium chloride (OCEAN) 0.65 % nasal spray Place 1 spray into the nose as needed for congestion.   Yes Historical Provider, MD  spironolactone (ALDACTONE) 50 MG tablet Take 50 mg by mouth 2 (two) times daily.    Yes Historical Provider, MD    Allergies:   Allergies  Allergen Reactions  . Aspirin Anaphylaxis and Swelling  . Nsaids Swelling  . Versed (Midazolam)     Makes me "jittery"    Social History:  reports that he has never smoked. He does not have any smokeless tobacco history on file. He reports that he does not drink alcohol or use illicit drugs.  Family History: neg  Physical Exam: Filed Vitals:   06/12/12 1806 06/12/12 1827 06/12/12 1903 06/12/12 2022  BP: 88/43 90/46 111/61 88/60  Pulse: 75  83 86  Temp:    98 F (36.7 C)  TempSrc:  Resp: 16 16 16    Height:    6' (1.829 m)  Weight:    109.8 kg (242 lb 1 oz)  SpO2: 100% 100% 100% 98%   General appearance: alert, cooperative, no distress and pale Head: Normocephalic, without obvious abnormality, atraumatic Eyes: negative Nose: Nares normal. Septum midline. Mucosa normal. No drainage or sinus tenderness. Neck: no JVD and supple, symmetrical, trachea midline Lungs: clear to auscultation bilaterally Heart: regular rate and rhythm, S1, S2 normal, no murmur, click, rub or gallop Abdomen: soft, non-tender; bowel sounds normal; no masses,  no organomegaly Extremities: extremities normal, atraumatic, no cyanosis or edema Pulses: 2+ and symmetric Skin: Skin color, texture, turgor normal. No rashes or lesions Neurologic: Grossly normal    Labs on Admission:    Recent Labs  06/12/12 1636  NA 138  K 4.8  CL 103  CO2 27  GLUCOSE 128*  BUN 40*  CREATININE 0.60  CALCIUM 8.2*    Recent Labs  06/12/12 1636  AST 24  ALT 10  ALKPHOS 97  BILITOT 0.4  PROT 6.1  ALBUMIN 2.6*    Recent Labs  06/12/12 1636  LIPASE 65*    Recent Labs  06/12/12 1636  WBC 14.2*  NEUTROABS 5.1  HGB 8.8*  HCT 26.6*  MCV 87.8  PLT 154    Radiological Exams on Admission: No results found.  Assessment/Plan 62 yo male h/o nash/cirrhosis with UGIB  Principal Problem:   GIB (gastrointestinal bleeding) Active Problems:   Hematemesis/vomiting blood   Melena   NASH (nonalcoholic steatohepatitis)   Cirrhosis of liver without mention of alcohol   Acute blood loss anemia  Place on protonix gtt.  Transfuse 2 units prbc with 2 units back up.  Iv vit k.  Called GI dr Arlyce Dice who agreed to start octreotide load and gtt.  Place on rocephin.  Keep npo overnight for egd in am.  Admit to stepdown.  Full code discussed with patient.    DAVID,RACHAL A 06/12/2012, 10:55 PM   Initial cc time 50 minutes

## 2012-06-12 NOTE — ED Notes (Signed)
Diarrhea and vomiting all night. Bright red blood emesis, hx of GI bleed

## 2012-06-12 NOTE — ED Notes (Signed)
EDP notified of [facility] RN concern and is agreeable

## 2012-06-12 NOTE — ED Notes (Signed)
Report given to Samantha/[facility] and [facility] RN

## 2012-06-12 NOTE — ED Provider Notes (Signed)
History     CSN: [id number]  Arrival date & time 06/12/12  1545   First MD Initiated Contact with Patient 06/12/12 1622      Chief Complaint  Patient presents with  . Emesis    (Consider location/radiation/quality/duration/timing/severity/associated sxs/prior treatment) HPI Comments: Patient presents with diarrhea abdominal pain that started last night. The stool is dark black. Diarrhea seemed to taper off overnight. This morning and episode of nausea followed by hematemesis there was bright red blood x2. Has a history of NASH and variceal GI bleed. Denies being on any anticoagulants. Denies any abdominal pain at this point. Endorses lightheadedness and dizziness. No chest pain or shortness of breath. No fevers or chills. Last endoscopy was in [facility] 2011 and he was told that his colonoscopy was cleaned and that he had esophageal varices.  The history is provided by the patient.    Past Medical History  Diagnosis Date  . Arthritis   . Diabetes mellitus   . Hypertension   . MI, old   . Hyperlipidemia   . Sleep apnea   . Narcolepsy     Past Surgical History  Procedure Laterality Date  . Cataract extraction    . Joint replacement      No family history on file.  History  Substance Use Topics  . Smoking status: Never Smoker   . Smokeless tobacco: Not on file  . Alcohol Use: No      Review of Systems  Constitutional: Positive for activity change, appetite change and fatigue. Negative for fever.  HENT: Negative for congestion and rhinorrhea.   Respiratory: Negative for cough and chest tightness.   Cardiovascular: Negative for chest pain.  Gastrointestinal: Positive for nausea, vomiting, abdominal pain, diarrhea and blood in stool.  Genitourinary: Negative for dysuria.  Musculoskeletal: Negative for back pain.  Skin: Positive for pallor. Negative for rash.  Neurological: Positive for dizziness, weakness and light-headedness.  A complete 10 system review of  systems was obtained and all systems are negative except as noted in the HPI and PMH.    Allergies  Aspirin; Nsaids; and Versed  Home Medications   No current outpatient prescriptions on file.  BP 101/59  Pulse 87  Temp(Src) 97.8 F (36.6 C) (Oral)  Resp 11  Ht 6' (1.829 m)  Wt 242 lb 1 oz (109.8 kg)  BMI 32.82 kg/m2  SpO2 97%  Physical Exam  Constitutional: He is oriented to person, place, and time. He appears well-developed and well-nourished.  Appears pale  HENT:  Head: Normocephalic and atraumatic.  Mouth/Throat: Oropharynx is clear and moist. No oropharyngeal exudate.  Eyes: Conjunctivae and EOM are normal. Pupils are equal, round, and reactive to light.  Neck: Normal range of motion. Neck supple.  Cardiovascular: Normal rate, regular rhythm and normal heart sounds.   No murmur heard. Pulmonary/Chest: Effort normal and breath sounds normal. No respiratory distress.  Abdominal: Soft. There is no tenderness. There is no rebound and no guarding.  Genitourinary: Guaiac positive stool.  Black melanotic stools  Musculoskeletal: Normal range of motion. He exhibits no edema and no tenderness.  Neurological: He is alert and oriented to person, place, and time.  Skin: Skin is warm. There is pallor.    ED Course  Procedures (including critical care time)  Labs Reviewed  MRSA PCR SCREENING - Abnormal; Notable for the following:    MRSA by PCR POSITIVE (*)    All other components within normal limits  CBC WITH DIFFERENTIAL - Abnormal; Notable for  the following:    WBC 14.2 (*)    RBC 3.03 (*)    Hemoglobin 8.8 (*)    HCT 26.6 (*)    RDW 17.8 (*)    Neutrophils Relative 36 (*)    Eosinophils Relative 42 (*)    Eosinophils Absolute 5.9 (*)    All other components within normal limits  COMPREHENSIVE METABOLIC PANEL - Abnormal; Notable for the following:    Glucose, Bld 128 (*)    BUN 40 (*)    Calcium 8.2 (*)    Albumin 2.6 (*)    All other components within normal  limits  LACTIC ACID, PLASMA - Abnormal; Notable for the following:    Lactic Acid, Venous 2.4 (*)    All other components within normal limits  LIPASE, BLOOD - Abnormal; Notable for the following:    Lipase 65 (*)    All other components within normal limits  PROTIME-INR - Abnormal; Notable for the following:    Prothrombin Time 17.0 (*)    All other components within normal limits  AMMONIA - Abnormal; Notable for the following:    Ammonia 95 (*)    All other components within normal limits  OCCULT BLOOD X 1 CARD TO LAB, STOOL - Abnormal; Notable for the following:    Fecal Occult Bld POSITIVE (*)    All other components within normal limits  APTT  PATHOLOGIST SMEAR REVIEW  BASIC METABOLIC PANEL  PROTIME-INR  HEMOGLOBIN AND HEMATOCRIT, BLOOD  HEMOGLOBIN AND HEMATOCRIT, BLOOD  HEMOGLOBIN AND HEMATOCRIT, BLOOD  HEMOGLOBIN AND HEMATOCRIT, BLOOD  PREPARE RBC (CROSSMATCH)  TYPE AND SCREEN   No results found.   1. GI bleed   2. Acute blood loss anemia   3. Cirrhosis of liver without mention of alcohol   4. Hematemesis/vomiting blood   5. Melena   6. NASH (nonalcoholic steatohepatitis)       MDM  History of NASH presenting with melena and hematemesis was started yesterday. Associated with dizziness and shortness of breath and lightheadedness. Denies chest pain. No anticoagulants.  Patient is no distress vitals are stable. Hemoglobin is 8.8 from his baseline of around 11. Guaiac is positive.  Patient started on IV protonix, IV fluids. Somatostatin was ordered but not in stock at this facility.  Patient became more hypotensive throughout his ED stay with blood pressure dipping into the 90s. Heart rate remains in the 70s to 80s. Patient remained alert and talkative.  Admission discussed with Dr. Lavera Guise of the hospitalists.   Date: 06/12/2012  Rate: 77  Rhythm: normal sinus rhythm  QRS Axis: normal  Intervals: normal  ST/T Wave abnormalities: normal  Conduction  Disutrbances:none  Narrative Interpretation:   Old EKG Reviewed: none available  CRITICAL CARE Performed by: Glynn Octave   Total critical care time: 40  Critical care time was exclusive of separately billable procedures and treating other patients.  Critical care was necessary to treat or prevent imminent or life-threatening deterioration.  Critical care was time spent personally by me on the following activities: development of treatment plan with patient and/or surrogate as well as nursing, discussions with consultants, evaluation of patient's response to treatment, examination of patient, obtaining history from patient or surrogate, ordering and performing treatments and interventions, ordering and review of laboratory studies, ordering and review of radiographic studies, pulse oximetry and re-evaluation of patient's condition.        Glynn Octave, MD 06/13/12 [phone]

## 2012-06-13 ENCOUNTER — Encounter (HOSPITAL_COMMUNITY): Payer: Self-pay | Admitting: *Deleted

## 2012-06-13 ENCOUNTER — Encounter (HOSPITAL_COMMUNITY)
Admission: EM | Disposition: A | Discharge status: Home / Self Care | Payer: Self-pay | Source: Home / Self Care | Attending: Internal Medicine

## 2012-06-13 DIAGNOSIS — I10 Essential (primary) hypertension: Secondary | ICD-10-CM | POA: Diagnosis present

## 2012-06-13 DIAGNOSIS — K7682 Hepatic encephalopathy: Secondary | ICD-10-CM | POA: Diagnosis present

## 2012-06-13 DIAGNOSIS — E119 Type 2 diabetes mellitus without complications: Secondary | ICD-10-CM | POA: Diagnosis present

## 2012-06-13 DIAGNOSIS — I8501 Esophageal varices with bleeding: Secondary | ICD-10-CM | POA: Diagnosis present

## 2012-06-13 DIAGNOSIS — K2961 Other gastritis with bleeding: Secondary | ICD-10-CM

## 2012-06-13 DIAGNOSIS — K7460 Unspecified cirrhosis of liver: Secondary | ICD-10-CM

## 2012-06-13 DIAGNOSIS — E860 Dehydration: Secondary | ICD-10-CM | POA: Diagnosis present

## 2012-06-13 DIAGNOSIS — K922 Gastrointestinal hemorrhage, unspecified: Secondary | ICD-10-CM

## 2012-06-13 HISTORY — DX: Other gastritis with bleeding: K29.61

## 2012-06-13 HISTORY — DX: Esophageal varices with bleeding: I85.01

## 2012-06-13 HISTORY — PX: ESOPHAGOGASTRODUODENOSCOPY: SHX5428

## 2012-06-13 LAB — PROTIME-INR: INR: 1.37 (ref 0.00–1.49)

## 2012-06-13 LAB — BASIC METABOLIC PANEL
BUN: 32 mg/dL — ABNORMAL HIGH (ref 6–23)
Chloride: 110 mEq/L (ref 96–112)
GFR calc Af Amer: >90 mL/min (ref >90)
GFR calc non Af Amer: >90 mL/min (ref >90)
Glucose, Bld: 95 mg/dL (ref 70–99)
Potassium: 4.4 mEq/L (ref 3.5–5.1)
Sodium: 142 mEq/L (ref 135–145)

## 2012-06-13 LAB — CBC
HCT: 24.8 % — ABNORMAL LOW (ref 39.0–52.0)
MCH: 28.2 pg (ref 26.0–34.0)
MCHC: 34.3 g/dL (ref 30.0–36.0)
MCV: 84 fL (ref 78.0–100.0)
Platelets: 110 10*3/uL — ABNORMAL LOW (ref 150–400)
RBC: 2.87 MIL/uL — ABNORMAL LOW (ref 4.22–5.81)
RDW: 17.7 % — ABNORMAL HIGH (ref 11.5–15.5)
RDW: 17.8 % — ABNORMAL HIGH (ref 11.5–15.5)
WBC: 10.7 10*3/uL — ABNORMAL HIGH (ref 4.0–10.5)
WBC: 10.2 10*3/uL (ref 4.0–10.5)

## 2012-06-13 LAB — HEMOGLOBIN AND HEMATOCRIT, BLOOD
HCT: 20.1 % — ABNORMAL LOW (ref 39.0–52.0)
Hemoglobin: 7 g/dL — ABNORMAL LOW (ref 13.0–17.0)

## 2012-06-13 LAB — GLUCOSE, CAPILLARY: Glucose-Capillary: 107 mg/dL — ABNORMAL HIGH (ref 70–99)

## 2012-06-13 LAB — PREPARE RBC (CROSSMATCH)

## 2012-06-13 SURGERY — EGD (ESOPHAGOGASTRODUODENOSCOPY)
Anesthesia: Moderate Sedation

## 2012-06-13 MED ORDER — SODIUM CHLORIDE 0.9 % IV SOLN
INTRAVENOUS | Status: DC
  Administered 2012-06-13: 13:00:00 via INTRAVENOUS

## 2012-06-13 MED ORDER — DIPHENHYDRAMINE HCL 50 MG/ML IJ SOLN
INTRAMUSCULAR | Status: DC | PRN
  Administered 2012-06-13: 25 mg via INTRAVENOUS

## 2012-06-13 MED ORDER — FENTANYL CITRATE 0.05 MG/ML IJ SOLN
INTRAMUSCULAR | Status: DC | PRN
  Administered 2012-06-13 (×2): 25 ug via INTRAVENOUS

## 2012-06-13 MED ORDER — DIAZEPAM 5 MG/ML IJ SOLN
INTRAMUSCULAR | Status: AC
  Filled 2012-06-13: qty 2

## 2012-06-13 MED ORDER — INSULIN ASPART 100 UNIT/ML ~~LOC~~ SOLN
0.0000 [IU] | SUBCUTANEOUS | Status: DC
  Administered 2012-06-13: 1 [IU] via SUBCUTANEOUS

## 2012-06-13 MED ORDER — DIPHENHYDRAMINE HCL 25 MG PO TABS
50.0000 mg | ORAL_TABLET | Freq: Every evening | ORAL | Status: DC | PRN
  Filled 2012-06-13: qty 2

## 2012-06-13 MED ORDER — FUROSEMIDE 40 MG PO TABS
40.0000 mg | ORAL_TABLET | Freq: Every day | ORAL | Status: DC
  Administered 2012-06-13 – 2012-06-15 (×3): 40 mg via ORAL
  Filled 2012-06-13 (×3): qty 1

## 2012-06-13 MED ORDER — RIVASTIGMINE 9.5 MG/24HR TD PT24
9.5000 mg | MEDICATED_PATCH | Freq: Every morning | TRANSDERMAL | Status: DC
  Administered 2012-06-14 – 2012-06-15 (×2): 9.5 mg via TRANSDERMAL
  Filled 2012-06-13 (×2): qty 1

## 2012-06-13 MED ORDER — FLUTICASONE PROPIONATE 50 MCG/ACT NA SUSP
2.0000 | Freq: Every evening | NASAL | Status: DC | PRN
  Administered 2012-06-13 – 2012-06-14 (×2): 2 via NASAL
  Filled 2012-06-13: qty 16

## 2012-06-13 MED ORDER — DIAZEPAM 5 MG/ML IJ SOLN
INTRAMUSCULAR | Status: DC | PRN
  Administered 2012-06-13 (×2): 2.5 mg via INTRAVENOUS

## 2012-06-13 MED ORDER — SODIUM CHLORIDE 0.9 % IV SOLN: 1000.0000 mL | INTRAVENOUS | Status: DC

## 2012-06-13 MED ORDER — MODAFINIL 200 MG PO TABS
200.0000 mg | ORAL_TABLET | Freq: Two times a day (BID) | ORAL | Status: DC
  Administered 2012-06-14 – 2012-06-15 (×4): 200 mg via ORAL
  Filled 2012-06-13 (×4): qty 1

## 2012-06-13 MED ORDER — LACTULOSE 10 GM/15ML PO SOLN
20.0000 g | Freq: Two times a day (BID) | ORAL | Status: DC
  Administered 2012-06-13 – 2012-06-15 (×4): 20 g via ORAL
  Filled 2012-06-13 (×6): qty 30

## 2012-06-13 MED ORDER — DIPHENHYDRAMINE HCL 50 MG/ML IJ SOLN
INTRAMUSCULAR | Status: AC
  Filled 2012-06-13: qty 1

## 2012-06-13 MED ORDER — SPIRONOLACTONE 50 MG PO TABS
50.0000 mg | ORAL_TABLET | Freq: Two times a day (BID) | ORAL | Status: DC
  Administered 2012-06-13 – 2012-06-15 (×3): 50 mg via ORAL
  Filled 2012-06-13 (×6): qty 1

## 2012-06-13 MED ORDER — CITALOPRAM HYDROBROMIDE 10 MG PO TABS
10.0000 mg | ORAL_TABLET | Freq: Every day | ORAL | Status: DC
  Administered 2012-06-13 – 2012-06-15 (×3): 10 mg via ORAL
  Filled 2012-06-13 (×3): qty 1

## 2012-06-13 MED ORDER — PREGABALIN 25 MG PO CAPS
100.0000 mg | ORAL_CAPSULE | Freq: Every day | ORAL | Status: DC
  Administered 2012-06-13 – 2012-06-14 (×2): 100 mg via ORAL
  Filled 2012-06-13 (×2): qty 4

## 2012-06-13 MED ORDER — SALINE SPRAY 0.65 % NA SOLN
1.0000 | NASAL | Status: DC | PRN
  Filled 2012-06-13: qty 44

## 2012-06-13 MED ORDER — BUTAMBEN-TETRACAINE-BENZOCAINE 2-2-14 % EX AERO
INHALATION_SPRAY | CUTANEOUS | Status: DC | PRN
  Administered 2012-06-13: 2 via TOPICAL

## 2012-06-13 MED ORDER — OXYCODONE HCL 5 MG PO CAPS: 5.0000 mg | ORAL_CAPSULE | Freq: Three times a day (TID) | ORAL | Status: DC

## 2012-06-13 MED ORDER — LACTULOSE 10 GM/15ML PO SOLN: 7.5000 g | Freq: Two times a day (BID) | ORAL | Status: DC

## 2012-06-13 MED ORDER — SALINE NASAL SPRAY 0.65 % NA SOLN: 1.0000 | NASAL | Status: DC | PRN

## 2012-06-13 MED ORDER — ATENOLOL 50 MG PO TABS
50.0000 mg | ORAL_TABLET | Freq: Every day | ORAL | Status: DC
  Administered 2012-06-13: 50 mg via ORAL
  Filled 2012-06-13 (×2): qty 1

## 2012-06-13 MED ORDER — FENTANYL CITRATE 0.05 MG/ML IJ SOLN
INTRAMUSCULAR | Status: AC
  Filled 2012-06-13: qty 4

## 2012-06-13 MED ORDER — OXYCODONE HCL 5 MG PO TABS
5.0000 mg | ORAL_TABLET | Freq: Three times a day (TID) | ORAL | Status: DC
  Administered 2012-06-13 – 2012-06-14 (×6): 5 mg via ORAL
  Filled 2012-06-13 (×6): qty 1

## 2012-06-13 MED ORDER — DEXTROSE-NACL 5-0.9 % IV SOLN
INTRAVENOUS | Status: DC
  Administered 2012-06-13 – 2012-06-14 (×2): via INTRAVENOUS

## 2012-06-13 NOTE — Op Note (Signed)
[facility] [address] [place], [address]   ENDOSCOPY PROCEDURE REPORT  PATIENT: Nicholas Petersen, Nicholas Petersen  MR#: [id number] BIRTHDATE: 1951-03-22 , 61  yrs. old GENDER: Male ENDOSCOPIST: Iva Boop, MD, [facility] REFERRED BY:  [facility] PROCEDURE DATE:  06/13/2012 PROCEDURE:  EGD w/ band ligation of varices ASA CLASS:     Class IV INDICATIONS:  Hematemesis.  Melana, cirrhosis MEDICATIONS: Fentanyl 50 mcg IV TOPICAL ANESTHETIC: Cetacaine Spray  DESCRIPTION OF PROCEDURE: After the risks benefits and alternatives of the procedure were thoroughly explained, informed consent was obtained.  The Pentax Gastroscope [id number] endoscope was introduced through the mouth and advanced to the second portion of the duodenum. Without limitations.  The instrument was slowly withdrawn as the mucosa was fully examined.        ESOPHAGUS: There were 2 columns of medium size varices in the distal and middle third of the esophagus.  The were and there was evidence of a white nipple sign and evidence of a cherry spot sign.  Band ligation x 3 was performed.  STOMACH: Erosive gastritis wuith clean-based small to  medium sized erosions found in the gastric antrum.  The remainder of the upper endoscopy exam was otherwise normal. Retroflexed views revealed no abnormalities.     The scope was then withdrawn from the patient and the procedure completed.  COMPLICATIONS: There were no complications. ENDOSCOPIC IMPRESSION: 1.   There were 2 columns of medium size varices in the distal and middle third of the esophagus; The were was evidence of a white nipple sign and evidence of a cherry spot sign; band ligation x 3 was performed 2.   Erosive gastritis  in the gastric antrum 3.   The remainder of the upper endoscopy exam was otherwise normal 4. I think bled from both varices and gastritis RECOMMENDATIONS: Continue PPI infusion until tomorrow. Octreotiide x 48-72 hrs Start  lactulose will need repeat EGD 2-4 weeks - primary GI Dr. Noe Gens check H. pylori serology   e]  eSigned:  Iva Boop, MD, [facility] 06/13/2012 12:36 PM   CC: Nance Pew, MD  PATIENT NAME:  Nicholas Petersen, Nicholas Petersen MR#: [id number]

## 2012-06-13 NOTE — Consult Note (Signed)
[facility] Consult: 8:56 AM 06/13/2012   Referring Provider: Tarry Kos  Primary Care Physician:  Gilman Schmidt at [facility] Primary Gastroenterologist:  Dr. Rodena Piety in [place]  ALL MDS ARE IN [place]   Reason for Consultation:  GI bleed, melena, hemetemesis  HPI: Nicholas Petersen is a 62 y.o. male.  Inumumerable medical problems, more than one typed page of PMH in sent records including NASH cirrhosis. Takes meds for dementia.  About 2 weeks ago had umbilical hernia repair and liver biopsy showing cirrhosis of uncertain source.  Hx GI bleed (dark  Stools) with colon/EGD in 03/2010  By Dr Conley Rolls.  We have no records of what was found, but records state no findings on colon, which pt confirms.   Takes Oxycodone, Lactulose, aldactone, Lasix and many other meds  Has ascites on yesterday's exam at PMD  Presented to PMD with dark stools beginning PM of 3/11, then dark/bloody emesis AM of 3/12.  Went to see PMD and sent to ED.  Hgb 8.8 c/w 9.7 02/2011, coags 17/1.4, BUN 40 c/w 12 in 05/2010.    2 units PRBCs transfused so far.  No belly pain, no NSAIDs  Had paracentesis about 6 months ago.  No new swelling.  Had gen surg follow up on 3/10, felt to be doing well.    Past Medical History  Diagnosis Date  . Arthritis   . Diabetes mellitus   . Hypertension   . MI, old   . Hyperlipidemia   . Sleep apnea   . Narcolepsy   I reviewed  A page long  List of problems as well :  Add ANEMIA, cognitive impairment, Prostatism, alzheimer's, hepatic encephalopathy  Past Surgical History  Procedure Laterality Date  . Cataract extraction    . Joint replacement.  Knee replacement          Umbilical hernia repair  06/2012       Spinal surgery   Prior to Admission medications   Medication Sig Start Date End Date Taking? Authorizing Provider  atenolol (TENORMIN) 50 MG tablet Take 50 mg by mouth daily.   Yes Historical Provider, MD   citalopram (CELEXA) 10 MG tablet Take 5-10 mg by mouth daily.    Yes Historical Provider, MD  diphenhydrAMINE (BENADRYL) 50 MG tablet Take 50 mg by mouth at bedtime as needed for itching.   Yes Historical Provider, MD  fluticasone (FLONASE) 50 MCG/ACT nasal spray Place 2 sprays into the nose at bedtime and may repeat dose one time if needed.   Yes Historical Provider, MD  furosemide (LASIX) 80 MG tablet Take 40 mg by mouth daily.   Yes Historical Provider, MD  lactulose (CHRONULAC) 10 GM/15ML solution Take 7.5 g by mouth 2 (two) times daily.   Yes Historical Provider, MD  modafinil (PROVIGIL) 200 MG tablet Take 200 mg by mouth 2 (two) times daily with breakfast and lunch.   Yes Historical Provider, MD  oxycodone (OXY-IR) 5 MG capsule Take 5 mg by mouth 4 (four) times daily - after meals and at bedtime.    Yes Historical Provider, MD  pregabalin (LYRICA) 100 MG capsule Take 100 mg by mouth at bedtime.   Yes Historical Provider, MD  rivastigmine (EXELON) 9.5 mg/24hr Place 1 patch onto the skin every morning.   Yes Historical Provider, MD  sodium chloride (OCEAN) 0.65 % nasal spray Place 1 spray into the nose as needed for congestion.   Yes Historical Provider, MD  spironolactone (ALDACTONE) 50 MG tablet  Take 50 mg by mouth 2 (two) times daily.    Yes Historical Provider, MD    Scheduled Meds: . cefTRIAXone (ROCEPHIN)  IV  1 g Intravenous QHS  . Chlorhexidine Gluconate Cloth  6 each Topical Q0600  . mupirocin ointment  1 application Nasal BID  . ondansetron  4 mg Intravenous Once   Infusions: . sodium chloride    . sodium chloride 125 mL/hr at 06/13/12 0006  . octreotide (SANDOSTATIN) infusion 25 mcg/hr (06/13/12 0028)  . pantoprozole (PROTONIX) infusion 8 mg/hr (06/13/12 0007)  . pantoprozole (PROTONIX) infusion     PRN Meds: ondansetron (ZOFRAN) IV, ondansetron   Allergies as of 06/12/2012 - Review Complete 06/12/2012  Allergen Reaction Noted  . Aspirin Anaphylaxis and Swelling  06/12/2012  . Nsaids Swelling 06/12/2012  . Versed (midazolam)  06/12/2012    No family history on file.  History   Social History  . Marital Status: Married    Spouse Name: N/A    Number of Children: N/A  . Years of Education: N/A   Occupational History  . Not on file.   Social History Main Topics  . Smoking status: Never Smoker   . Smokeless tobacco: Not on file  . Alcohol Use: No  . Drug Use: No  . Sexually Active: No   Other Topics Concern  . Not on file   Social History Narrative  . No narrative on file    REVIEW OF SYSTEMS: No new swelliing. No SOB or cough.  No chest pain.  No nausea at present. Still melenic stools.   No headaches, no seizures, no etoh, no hx varices.   No travel. Frequent minor nasal bleeding Balance problems, needs to use cane.  Frequently loses balance and falls but not in last 3 weeks  PHYSICAL EXAM: Vital signs in last 24 hours: Temp:  [97.3 F (36.3 C)-98.3 F (36.8 C)] 97.8 F (36.6 C) (03/13 0847) Pulse Rate:  [75-90] 80 (03/13 0847) Resp:  [10-22] 11 (03/13 0847) BP: (88-129)/(43-76) 111/66 mmHg (03/13 0847) SpO2:  [97 %-100 %] 98 % (03/13 0847) FiO2 (%):  [100 %] 100 % (03/12 1806) Weight:  [103.874 kg (229 lb)-109.8 kg (242 lb 1 oz)] 109.8 kg (242 lb 1 oz) (03/12 2022)  General: looks chronically and  Acutely unwell Head:  No  Signs of trauma.  Eyes:  No icterus, conj is pale Ears:  Not HOH  Nose:  No bleeding Mouth:  No sores, poor dentition, no blood Neck:  No mass Lungs:  Clear B Heart: RRR, rate in 70s,  No MRG Abdomen:  Soft, healing umbilical scar, bulging flanks.  No HSM.  Not tender.   Rectal: black, copious melenic stool in bed pan   Musc/Skeltl: no joint swellilng Extremities:  No pedal edema.  Feet warm  Neurologic:  Oriented x 3, slow but accurate historian.  Slight hand tremor not clearly asterixis Skin:  No telangectasia, no jaundice Tattoos:  none Nodes:  No inguinal adenopathy GU:  No scrotal  edema   Psych:  Pleasant, cooperative  Intake/Output from previous day: 03/12 0701 - 03/13 0700 In: 1325 [I.V.:1225; IV Piggyback:100] Out: 400 [Urine:400] Intake/Output this shift:    LAB RESULTS:  Recent Labs  06/12/12 1636 06/13/12 0021  WBC 14.2*  --   HGB 8.8* 7.0*  HCT 26.6* 20.1*  PLT 154  --    BMET Lab Results  Component Value Date   NA 138 06/12/2012   NA 140 05/06/2010   NA 140  05/05/2010   K 4.8 06/12/2012   K 3.4* 05/06/2010   K 3.4* 05/05/2010   CL 103 06/12/2012   CL 101 05/06/2010   CL 104 05/05/2010   CO2 27 06/12/2012   CO2 28 05/06/2010   CO2 25 05/05/2010   GLUCOSE 128* 06/12/2012   GLUCOSE 132* 05/06/2010   GLUCOSE 131* 05/05/2010   BUN 40* 06/12/2012   BUN 12 05/06/2010   BUN 18 05/05/2010   CREATININE 0.60 06/12/2012   CREATININE 0.80 05/06/2010   CREATININE 0.69 05/05/2010   CALCIUM 8.2* 06/12/2012   CALCIUM 8.9 05/06/2010   CALCIUM 8.8 05/05/2010   LFT  Recent Labs  06/12/12 1636  PROT 6.1  ALBUMIN 2.6*  AST 24  ALT 10  ALKPHOS 97  BILITOT 0.4   PT/INR Lab Results  Component Value Date   INR 1.42 06/12/2012   INR 1.08 05/06/2010   INR 1.04 05/05/2010   Hepatitis Panel No results found for this basename: HEPBSAG, HCVAB, HEPAIGM, HEPBIGM,  in the last 72 hours C-Diff No components found with this basename: cdiff    Drugs of Abuse     Component Value Date/Time   LABOPIA NONE DETECTED 05/05/2010 0356   COCAINSCRNUR NONE DETECTED 05/05/2010 0356   LABBENZ POSITIVE* 05/05/2010 0356   AMPHETMU NONE DETECTED 05/05/2010 0356   THCU NONE DETECTED 05/05/2010 0356   LABBARB  Value: NONE DETECTED        DRUG SCREEN FOR MEDICAL PURPOSES ONLY.  IF CONFIRMATION IS NEEDED FOR ANY PURPOSE, NOTIFY LAB WITHIN 5 DAYS.        LOWEST DETECTABLE LIMITS FOR URINE DRUG SCREEN Drug Class       Cutoff (ng/mL) Amphetamine      1000 Barbiturate      200 Benzodiazepine   200 Tricyclics       300 Opiates          300 Cocaine          300 THC              50 05/05/2010 0356     RADIOLOGY  STUDIES: No results found.  ENDOSCOPIC STUDIES: Per HPI  IMPRESSION: *  UGIB.  Rule out Varices, ulcer.  On Protonix and Octreotide drip *  Blood loss  Anemia and chronic anemia.  S/p one unit  Blood.  *  Cirrhosis *  Hx ascites *  Dementia and hx encephalopathy.  Not confused currently. *  Umbilical hernia repair and liver bx 05/29/12  PLAN: *  EGD will discuss timing with MD   LOS: 1 day   Jennye Moccasin  06/13/2012, 8:56 AM Pager: [phone]     [facility] Attending  I have also seen and assessed the patient and agree with the above note. Needs diagnostic/therapeutic EGD  Iva Boop, MD, [facility] [phone] (pager) 06/13/2012 11:56 AM

## 2012-06-13 NOTE — Progress Notes (Signed)
[facility] Progress Note [place] TEAM 1 - Stepdown/ICU TEAM   Mynor Witkop GNF:[id number] DOB: [birth year] DOA: 06/12/2012 PCP: No primary provider on file.  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 Primarily followed by Dr. Bertis Ruddy. Developed dark stools on the day of admission. Overnight continue with black stools and progressed to bright red emesis. By the time he was evaluated by the admitting physician had not had any emesis for several hours but still persistent to loose stool. Endorses no prior history of esophageal bleeding in the past. No significant abdominal pain. No fevers. Does not use alcohol. His initial hemoglobin was 8.8, he had a white count of 14,200, an elevated BUN of 40 with a normal creatinine, and platelets were low normal 154,000. PT was mildly elevated at 17 with an INR of 1.42. He was admitted to the step down unit.  Assessment/Plan: Active Problems:   GIB (gastrointestinal bleeding) due to: A) Erosive gastritis with hemorrhage B) Esophageal varices with hemorrhage -post EGD this am/[facility] -cont. Octreotide gtt for 48-72 hours -cont PPI infusion until 3/14 -FU on H.pylori serology -rec. Repeat EGD 2- weeks w/ primary GI in [place]    Acute blood loss anemia -post 2 units PRBC's since admit -follow CBC q 12hrs -last hgb 8.1    NASH (nonalcoholic steatohepatitis)/Cirrhosis of liver without mention of alcohol/Encephalopathy, hepatic/Ascites -ammonia 95 and pt somewhat lethargic this am-GI has resumed Lactulose -FU w/ Dr. 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 CPAP at night -resume provigil once can take po's   DVT prophylaxis: SCDs Code Status: Full Family Communication: Patient Disposition Plan: Stepdown Isolation: Contact for MRSA positive status on nasal swab  Consultants: Gastroenterology  Procedures: EGD (06/13/12) ENDOSCOPIC IMPRESSION:  1. There were 2 columns of medium size varices in the distal and middle third of the esophagus; The were was evidence of a white nipple sign and evidence of a cherry spot sign; band ligation x 3 was performed  2. Erosive gastritis in the gastric antrum  3. The remainder of the upper endoscopy exam was otherwise normal  4. I think bled from both varices and gastritis  RECOMMENDATIONS:  A) Continue PPI infusion until tomorrow.  B) Octreotiide x 48-72 hrs  C) Start lactulose  D) Will need repeat EGD 2-4 weeks - primary GI Dr. Noe Gens  check H. pylori serology   Antibiotics: Rocephin 3/13 >>>  HPI/Subjective: Patient awakened from sleep and currently denies abdominal pain although was having some cramping while having diarrhea earlier. No chest pain or shortness of breath.   Objective: Blood pressure 112/66, pulse 72, temperature 98.4 F (36.9 C), temperature source Oral, resp. rate 12, height 6' (1.829 m), weight 109.8 kg (242 lb 1 oz), SpO2 99.00%.  Intake/Output Summary (Last 24 hours) at 06/13/12 1733 Last data filed at 06/13/12 1700  Gross per 24 hour  Intake 2789.17 ml  Output    400 ml  Net 2389.17 ml     Exam: General: No acute respiratory distress HEENT: Sclera  nonicteric and mildly injected, oral mucous membranes dry Lungs: Clear to auscultation bilaterally without wheezes or crackles, RA before EGD but now on 2 L Cardiovascular: Regular rate and rhythm without murmur gallop or rub normal S1 and S2, no peripheral edema or JVD Abdomen: Nontender,  lightly distended consistent with ascites, soft, bowel sounds positive, no rebound, no appreciable mass Musculoskeletal: No significant cyanosis, clubbing of bilateral lower extremities Neurological: Awake and somewhat lethargic, appears oriented, moves all extremities x4  Data Reviewed: Basic Metabolic Panel:  Recent Labs Lab 06/12/12 1636 06/13/12 0833  NA 138 142  K 4.8 4.4  CL 103 110  CO2 27 26  GLUCOSE 128* 95  BUN 40* 32*  CREATININE 0.60 0.66  CALCIUM 8.2* 7.6*   Liver Function Tests:  Recent Labs Lab 06/12/12 1636  AST 24  ALT 10  ALKPHOS 97  BILITOT 0.4  PROT 6.1  ALBUMIN 2.6*    Recent Labs Lab 06/12/12 1636  LIPASE 65*    Recent Labs Lab 06/12/12 1732  AMMONIA 95*   CBC:  Recent Labs Lab 06/12/12 1636 06/13/12 0021 06/13/12 0833  WBC 14.2*  --  10.7*  NEUTROABS 5.1  --   --   HGB 8.8* 7.0* 8.1*  HCT 26.6* 20.1* 24.1*  MCV 87.8  --  84.0  PLT 154  --  110*   Cardiac Enzymes: No results found for this basename: CKTOTAL, CKMB, CKMBINDEX, TROPONINI,  in the last 168 hours BNP (last 3 results) No results found for this basename: PROBNP,  in the last 8760 hours CBG:  Recent Labs Lab 06/13/12 1641  GLUCAP 107*    Recent Results (from the past 240 hour(s))  MRSA PCR SCREENING     Status: Abnormal   Collection Time    06/12/12  8:30 PM      Result Value Range Status   MRSA by PCR POSITIVE (*) NEGATIVE Final   Comment:            The GeneXpert MRSA Assay (FDA     approved for NASAL specimens     only), is one component of a     comprehensive MRSA colonization     surveillance program. It is not     intended to diagnose MRSA     infection nor to guide or     monitor treatment for     MRSA infections.     RESULT CALLED TO, READ BACK BY AND VERIFIED WITH:     Joya Salm RN [id number] 06/12/12 A BROWNING     Studies:  Recent x-ray studies have been reviewed in detail by the Attending Physician  Scheduled Meds:  Reviewed in detail by  the Attending Physician   Junious Silk, ANP [facility] Office  [phone] Pager [phone]  On-Call/Text Page:      [email or web address]      password [email or web address]  If 7PM-7AM, please contact night-coverage [email or web address] Password [facility] 06/13/2012, 5:33 PM   LOS: 1 day   I have examined the patient, reviewed the chart and modified the above note which I agree with.   RIZWAN,SAIMA,MD [phone] 06/13/2012, 5:33 PM

## 2012-06-14 ENCOUNTER — Encounter (HOSPITAL_COMMUNITY): Payer: Self-pay | Admitting: Internal Medicine

## 2012-06-14 ENCOUNTER — Inpatient Hospital Stay (HOSPITAL_COMMUNITY): Payer: Managed Care, Other (non HMO)

## 2012-06-14 LAB — PREPARE RBC (CROSSMATCH)

## 2012-06-14 LAB — CBC
HCT: 24.6 % — ABNORMAL LOW (ref 39.0–52.0)
HCT: 27.1 % — ABNORMAL LOW (ref 39.0–52.0)
Hemoglobin: 8.1 g/dL — ABNORMAL LOW (ref 13.0–17.0)
MCHC: 32.9 g/dL (ref 30.0–36.0)
MCHC: 34.3 g/dL (ref 30.0–36.0)
Platelets: 89 10*3/uL — ABNORMAL LOW (ref 150–400)
RDW: 17.7 % — ABNORMAL HIGH (ref 11.5–15.5)
WBC: 9.6 10*3/uL (ref 4.0–10.5)
WBC: 9.6 10*3/uL (ref 4.0–10.5)

## 2012-06-14 LAB — GLUCOSE, CAPILLARY
Glucose-Capillary: 128 mg/dL — ABNORMAL HIGH (ref 70–99)
Glucose-Capillary: 79 mg/dL (ref 70–99)
Glucose-Capillary: 77 mg/dL (ref 70–99)
Glucose-Capillary: 85 mg/dL (ref 70–99)
Glucose-Capillary: 76 mg/dL (ref 70–99)
Glucose-Capillary: 101 mg/dL — ABNORMAL HIGH (ref 70–99)

## 2012-06-14 MED ORDER — PANTOPRAZOLE SODIUM 40 MG PO TBEC
40.0000 mg | DELAYED_RELEASE_TABLET | Freq: Every day | ORAL | Status: DC
  Administered 2012-06-14 – 2012-06-15 (×2): 40 mg via ORAL
  Filled 2012-06-14 (×2): qty 1

## 2012-06-14 NOTE — Progress Notes (Signed)
     [facility] Daily Rounding Note 06/14/2012, 8:40 AM  SUBJECTIVE:       Describes stools as looking like "black currant jelly" .  Has some minor discomfort at region of GEJx.  Findings on EGD and intervention d/w pt.  He is hungry.   On clears.  Dr Butler Denmark ordered paracentesis and PRBC x one unit.     OBJECTIVE:         Vital signs in last 24 hours:    Temp:  [96.7 F (35.9 C)-98.4 F (36.9 C)] 96.7 F (35.9 C) (03/14 0739) Pulse Rate:  [55-80] 64 (03/14 0739) Resp:  [10-25] 15 (03/14 0739) BP: (83-144)/(44-92) 102/65 mmHg (03/14 0748) SpO2:  [94 %-100 %] 100 % (03/14 0739) Weight:  [114.8 kg (253 lb 1.4 oz)] 114.8 kg (253 lb 1.4 oz) (03/14 0003) Last BM Date: 06/13/12 General: looks chronically unwell.  Alert, comfortable   Heart: RRR Chest: clear bil.  Breathing easily Abdomen: soft, NT, not protuberant.  Umbilical hernia incision is intact  Extremities: no CCE GU:  No scrotal edema Neuro/Psych:  Pleasant, slow speech but accurate and not confused. No asterixis.    Lab Results:  Recent Labs  06/13/12 0833 06/13/12 2023 06/14/12 0735  WBC 10.7* 10.2 9.6  HGB 8.1* 8.5* 8.1*  HCT 24.1* 24.8* 24.6*  PLT 110* 97* 92*   BMET  Recent Labs  06/12/12 1636 06/13/12 0833  NA 138 142  K 4.8 4.4  CL 103 110  CO2 27 26  GLUCOSE 128* 95  BUN 40* 32*  CREATININE 0.60 0.66  CALCIUM 8.2* 7.6*   LFT  Recent Labs  06/12/12 1636  PROT 6.1  ALBUMIN 2.6*  AST 24  ALT 10  ALKPHOS 97  BILITOT 0.4   PT/INR  Recent Labs  06/12/12 1636 06/13/12 0833  LABPROT 17.0* 16.5*  INR 1.42 1.37    ASSESMENT: * UGIB. EGD 06/13/12:  2 columns esoph varices, band ligation x 3. Erosive gastritis noted as well.  On Protonix and Octreotide drip  * Acute Blood loss Anemia and chronic anemia. S/p one unit Blood. H & H stable.  *  Hx dementia and hepatic encephalopathy:  Lactulose restarted. Has been slow but appropriate during my visits.  * Cirrhosis secondary to NASH  * Hx  ascites  * Dementia and hx encephalopathy. Not confused currently.  * Umbilical hernia repair and liver bx 05/29/12     PLAN: *  Return to GI MD Dr Rodena Piety in [facility] for repeat EGD in 2 to 4 weeks.  *  Pt says he never had Hep B vaccination as he was either getting steroids or weekly Enbrel for his RA and did not want to stop the Enbrel to allow for vaccination.  *   Begin po Protonix, stop the drip.  Continue the Octreotide drip for 72 hours total.  *  Allow solid diet.  *  Cbc in AM *  Paracentesis per Dr Butler Denmark.    LOS: 2 days   Jennye Moccasin  06/14/2012, 8:40 AM Pager: [phone]  .[facility] Attending  I have also seen and assessed the patient and agree with the above note. H. Pylori serology is outstanding also. He could possibly  home tomorrow if no new problems.   Iva Boop, MD, [facility] [phone] (pager) 06/14/2012 12:35 PM

## 2012-06-14 NOTE — Progress Notes (Signed)
Therapeutic paracentesis ordered - consult order placed for RX to dose albumin. We usually use 25% albumin for this indication.  From UpToDate: Based on the available data, it is reasonable to forego albumin after paracenteses of 5 liters or less [37,38]. For larger paracenteses, albumin (6 to 8 g/L of fluid removed) can be administered.   Spoke with RN, Nicholas Petersen had 3.3 L removed in IR today.  Also spoke with Dr. Butler Denmark, okay NOT to give albumin today.  Pharmacy will sign-off, please contact if questions.  Thanks!  Tad Moore, BCPS  Clinical Pharmacist Pager [phone]  06/14/2012 5:28 PM

## 2012-06-14 NOTE — Progress Notes (Signed)
[facility] Progress Note [place] TEAM 1 - Stepdown/ICU TEAM   Nicholas Petersen ZOX:[id number] DOB: [birth year] DOA: 06/12/2012 PCP: No primary provider on file.  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 Primarily followed by Dr. Noe Gens in [facility] Developed dark stools on the day of admission. Overnight continue with black stools and progressed to bright red emesis. By the time he was evaluated by the admitting physician had not had any emesis for several hours but still persistent to loose stool. Endorses no prior history of esophageal bleeding in the past. No significant abdominal pain. No fevers. Does not use alcohol. His initial hemoglobin was 8.8, he had a white count of 14,200, an elevated BUN of 40 with a normal creatinine, and platelets were low normal 154,000. PT was mildly elevated at 17 with an INR of 1.42. He was admitted to the step down unit.  Assessment/Plan: Active Problems:   GIB (gastrointestinal bleeding) due to: A) Erosive gastritis with hemorrhage B) Esophageal varices with hemorrhage -post EGD 3/13/[facility] -cont. Octreotide gtt for total 48-72 hours (3/13) -cont PPI infusion until 3/14 -FU on H.pylori serology -rec. Repeat EGD 2- weeks w/ primary GI in [place]    Acute blood loss anemia -post 2 units PRBC's (3/13) give one more unit 3/14 -follow CBC q 12hrs -last hgb 8.1-baseline between 10 and 11    NASH (nonalcoholic steatohepatitis)/Cirrhosis of liver without mention of alcohol/Encephalopathy, hepatic/Ascites -ammonia 95 (3/13) and less lethargic this am after resumed Lactulose-rpt ammonia in am -endorsed increasing ascites prior to admit- ask IR to perform paracentesis-blood pressures are low and therefore he will be difficult to diurese -FU w/ Dr. Noe Gens after dc  -Cont. 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 Lyrica and Exelon  -not on meds PTA    OSA (obstructive sleep apnea)/Narcolepsy -? CPAP at night -cont. provigil    DVT prophylaxis: SCDs Code Status: Full Family Communication: Patient Disposition Plan: Stepdown Isolation: Contact for MRSA positive status on nasal swab  Consultants: Gastroenterology  Procedures: EGD (06/13/12) ENDOSCOPIC IMPRESSION:  1. There were 2 columns of medium size varices in the distal and middle third of the esophagus; The were was evidence of a white nipple sign and evidence of a cherry spot sign; band ligation x 3 was performed  2. Erosive gastritis in the gastric antrum  3. The remainder of the upper endoscopy exam was otherwise normal  4. I think bled from both varices and gastritis  RECOMMENDATIONS:  A) Continue PPI infusion until tomorrow.  B) Octreotiide x 48-72 hrs  C) Start lactulose  D) Will need repeat EGD 2-4 weeks - primary GI Dr. Noe Gens  check H. pylori serology   Antibiotics: Rocephin 3/13 >>>  HPI/Subjective: Patient awake and mostly oriented -complaining of increased distention of abdomen and discomfort-asking for paracentesis prior to leaving. Tolerating diet.   Objective: Blood pressure 104/61, pulse 61, temperature 97.9 F (36.6 C), temperature source Oral, resp. rate 15, height 6' (1.829 m), weight 114.8 kg (253 lb 1.4 oz), SpO2 100.00%.  Intake/Output Summary (Last 24 hours) at 06/14/12 1434 Last data filed at 06/14/12 1417  Gross per 24 hour  Intake 2857.5 ml  Output   2000 ml  Net  857.5 ml     Exam: General: Awake alert oriented x3, No acute respiratory distress HEENT: Sclera nonicteric and mildly injected, oral mucous membranes dry Lungs: Clear to  auscultation bilaterally without wheezes or crackles, RA  Cardiovascular: Regular rate and rhythm without murmur gallop or rub normal S1 and S2, no peripheral edema or JVD Abdomen: Nontender, moderately distended consistent with ascites, soft, bowel sounds positive, no rebound, no appreciable mass Musculoskeletal: No significant cyanosis, clubbing of bilateral lower extremities   Data Reviewed: Basic Metabolic Panel:  Recent Labs Lab 06/12/12 1636 06/13/12 0833  NA 138 142  K 4.8 4.4  CL 103 110  CO2 27 26  GLUCOSE 128* 95  BUN 40* 32*  CREATININE 0.60 0.66  CALCIUM 8.2* 7.6*   Liver Function Tests:  Recent Labs Lab 06/12/12 1636  AST 24  ALT 10  ALKPHOS 97  BILITOT 0.4  PROT 6.1  ALBUMIN 2.6*    Recent Labs Lab 06/12/12 1636  LIPASE 65*    Recent Labs Lab 06/12/12 1732  AMMONIA 95*   CBC:  Recent Labs Lab 06/12/12 1636 06/13/12 0021 06/13/12 0833 06/13/12 2023 06/14/12 0735  WBC 14.2*  --  10.7* 10.2 9.6  NEUTROABS 5.1  --   --   --   --   HGB 8.8* 7.0* 8.1* 8.5* 8.1*  HCT 26.6* 20.1* 24.1* 24.8* 24.6*  MCV 87.8  --  84.0 87.6 87.9  PLT 154  --  110* 97* 92*   Cardiac Enzymes: No results found for this basename: CKTOTAL, CKMB, CKMBINDEX, TROPONINI,  in the last 168 hours BNP (last 3 results) No results found for this basename: PROBNP,  in the last 8760 hours CBG:  Recent Labs Lab 06/13/12 2338 06/13/12 2341 06/14/12 0529 06/14/12 0827 06/14/12 1142  GLUCAP 66* 72 79 77 85    Recent Results (from the past 240 hour(s))  MRSA PCR SCREENING     Status: Abnormal   Collection Time    06/12/12  8:30 PM      Result Value Range Status   MRSA by PCR POSITIVE (*) NEGATIVE Final   Comment:            The GeneXpert MRSA Assay (FDA     approved for NASAL specimens     only), is one component of a     comprehensive MRSA colonization     surveillance program. It is not     intended to diagnose MRSA     infection nor to guide or     monitor  treatment for     MRSA infections.     RESULT CALLED TO, READ BACK BY AND VERIFIED WITH:     Joya Salm RN [id number] 06/12/12 A BROWNING     Studies:  Recent x-ray studies have been reviewed in detail by the Attending Physician  Scheduled Meds:  Reviewed in detail by the Attending Physician   Junious Silk, ANP [facility] Office  [phone] Pager [phone]  On-Call/Text Page:      [email or web address]      password [email or web address]  If 7PM-7AM, please contact night-coverage [email or web address] Password [facility] 06/14/2012, 2:34 PM   LOS: 2 days    06/14/2012, 2:34 PM  I have examined the patient, reviewed the chart and modified the above note which I agree with.   RIZWAN,SAIMA,MD [phone] 06/14/2012, 5:42 PM

## 2012-06-15 LAB — COMPREHENSIVE METABOLIC PANEL
ALT: 12 U/L (ref 0–53)
AST: 28 U/L (ref 0–37)
Alkaline Phosphatase: 75 U/L (ref 39–117)
CO2: 25 mEq/L (ref 19–32)
Calcium: 7.1 mg/dL — ABNORMAL LOW (ref 8.4–10.5)
GFR calc non Af Amer: >90 mL/min (ref >90)
Potassium: 3.6 mEq/L (ref 3.5–5.1)
Sodium: 141 mEq/L (ref 135–145)

## 2012-06-15 LAB — CBC
Hemoglobin: 9.5 g/dL — ABNORMAL LOW (ref 13.0–17.0)
MCH: 29.2 pg (ref 26.0–34.0)
Platelets: 85 10*3/uL — ABNORMAL LOW (ref 150–400)
RBC: 3.25 MIL/uL — ABNORMAL LOW (ref 4.22–5.81)
WBC: 7.7 10*3/uL (ref 4.0–10.5)

## 2012-06-15 LAB — AMMONIA: Ammonia: 72 umol/L — ABNORMAL HIGH (ref 11–60)

## 2012-06-15 MED ORDER — PANTOPRAZOLE SODIUM 40 MG PO TBEC: 40.0000 mg | DELAYED_RELEASE_TABLET | Freq: Every day | ORAL | Status: AC

## 2012-06-15 MED ORDER — ATENOLOL 50 MG PO TABS: 25.0000 mg | ORAL_TABLET | Freq: Every day | ORAL | Status: DC

## 2012-06-15 MED ORDER — OXYCODONE HCL 5 MG PO TABS
5.0000 mg | ORAL_TABLET | Freq: Four times a day (QID) | ORAL | Status: DC
  Administered 2012-06-15 (×2): 5 mg via ORAL
  Filled 2012-06-15 (×2): qty 1

## 2012-06-15 MED ORDER — LACTULOSE 10 GM/15ML PO SOLN: 20.0000 g | Freq: Two times a day (BID) | ORAL | Status: DC

## 2012-06-15 MED ORDER — MUPIROCIN 2 % EX OINT: 1.0000 "application " | TOPICAL_OINTMENT | Freq: Two times a day (BID) | CUTANEOUS | Status: DC

## 2012-06-15 NOTE — Discharge Summary (Signed)
Physician Discharge Summary  Nicholas Petersen ZOX:[id number] DOB: May 14, 1950 DOA: 06/12/2012  PCP: No primary provider on file.  Admit date: 06/12/2012 Discharge date: 06/15/2012  Time spent: >45 minutes  Recommendations for Outpatient Follow-up:  1. 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 Had several episodes overnight of black loose stools then on the day of admission had several episodes of vomiting with bright red blood. He presented to [facility] and was transferrred to [facility]. Pt has not had any vomiting in several hours. Still some loose stool but less frequent. No h/o esophageal bleeding in past. No abd pain. No swelling. No fevers. No cp. No etoh use. He gets all of his gi care at [facility].  Hospital Course:  GIB (gastrointestinal bleeding) due to:  A) Erosive gastritis with hemorrhage  B) Esophageal varices with hemorrhage  -post EGD 3/13/[facility]  -FU on H.pylori serology  - Cont Protonix daily - B Blocker discontinued for now due to hypotension and bradycardia -rec. Repeat EGD 2- weeks w/ primary GI in [place]   Acute blood loss anemia  -post 2 units  PRBC's (3/13) give one more unit 3/14  -follow CBC q 12hrs  -last hgb today is 9.5  NASH (nonalcoholic steatohepatitis)/Cirrhosis of liver without mention of alcohol/Encephalopathy  - Cont Lactulose as outpt  -FU w/ Dr. 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 Lyrica and Exelon  -not on meds PTA   OSA (obstructive sleep apnea)/Narcolepsy  - CPAP at night  -cont. provigil   Procedures:  EGD 3/13  Therapeutic Paracentesis (radiology) 3/14  Consultations:  GI- Dr Leone Payor  IR for paracentesis  Discharge Exam: Filed Vitals:   06/15/12 0800 06/15/12 0838 06/15/12 0840 06/15/12 0842  BP: 106/66 106/66 117/73 102/64  Pulse: 63 66 75 84  Temp: 96.6 F (35.9 C)     TempSrc: Oral     Resp: 13     Height:  Weight:      SpO2: 100%       General: alert, oriented x 3, no distress Cardiovascular: RRR, no murmurs Respiratory: CTA b/l  Abdomen: soft, NT, ND, BS+  Discharge Instructions      Discharge Orders   Future Orders Complete By Expires     Diet - low sodium heart healthy  As directed     Discharge instructions  As directed     Comments:      See your family doctor later this week for BP/HR check and hospital follow up    Increase activity slowly  As directed          Medication List    STOP taking these medications       atenolol 50 MG tablet  Commonly known as:  TENORMIN      TAKE these medications       citalopram 10 MG tablet  Commonly known as:  CELEXA  Take 5-10 mg by mouth daily.     diphenhydrAMINE 50 MG tablet  Commonly known as:  BENADRYL  Take 50 mg by mouth at bedtime as needed for itching.     fluticasone 50 MCG/ACT nasal spray  Commonly known as:  FLONASE  Place 2 sprays into the nose at bedtime and may repeat dose one time if needed.     furosemide 80 MG tablet  Commonly known as:  LASIX  Take 40 mg by mouth daily.     lactulose 10 GM/15ML solution  Commonly known as:  CHRONULAC  Take 30 mLs (20 g total) by mouth 2 (two) times daily.     modafinil 200 MG tablet  Commonly known as:  PROVIGIL  Take 200 mg by mouth 2 (two) times daily with breakfast and lunch.     mupirocin ointment 2 %  Commonly known as:  BACTROBAN  Apply 1 application topically 2 (two) times daily. Can finish off tube from hospital stay     oxycodone 5 MG capsule  Commonly known as:  OXY-IR  Take 5 mg by mouth 4 (four) times daily - after meals and at bedtime.     pantoprazole 40 MG tablet  Commonly known as:  PROTONIX  Take 1 tablet (40 mg total) by mouth daily at 6 (six) AM.     pregabalin 100 MG capsule  Commonly known as:  LYRICA  Take 100 mg by mouth at bedtime.     rivastigmine 9.5 mg/24hr  Commonly known as:  EXELON  Place 1 patch onto the skin every morning.     sodium chloride 0.65 % nasal spray  Commonly known as:  OCEAN  Place 1 spray into the nose as needed for congestion.     spironolactone 50 MG tablet  Commonly known as:  ALDACTONE  Take 50 mg by mouth 2 (two) times daily.       Follow-up Information   Follow up with Arlice Colt, MD. (for GI and liver disease follow up. )    Contact information:   [address] [place] [address] [phone]        The results of significant diagnostics from this  hospitalization (including imaging, microbiology, ancillary and laboratory) are listed below for reference.    Significant Diagnostic Studies: US Paracentesis  06/14/2012  *RADIOLOGY REPORT*  Clinical Data: Abdominal ascites  ULTRASOUND GUIDED PARACENTESIS  Comparison:  None  An ultrasound guided paracentesis was thoroughly discussed with the patient and questions answered.  The benefits, risks, alternatives and  complications were also discussed.  The patient understands and wishes to proceed with the procedure.  Written consent was obtained.  Ultrasound was performed to localize and mark an adequate pocket of fluid in the left lower quadrant of the abdomen.  The area was then prepped and draped in the normal sterile fashion.  1% Lidocaine was used for local anesthesia.  Under ultrasound guidance a 19 gauge Yueh catheter was introduced.  Paracentesis was performed.  The catheter was removed and a dressing applied.  Complications:  None  Findings:  A total of approximately 3.3 liters of yellow fluid was removed.  A fluid sample was not sent for laboratory analysis.  IMPRESSION: Successful ultrasound guided paracentesis yielding 3.3 liters of ascites.  Read by: Ralene Muskrat, P.A.-C   Original Report Authenticated By: Richarda Overlie, M.D.     Microbiology: Recent Results (from the past 240 hour(s))  MRSA PCR SCREENING     Status: Abnormal   Collection Time    06/12/12  8:30 PM      Result Value Range Status   MRSA by PCR POSITIVE (*) NEGATIVE Final   Comment:            The GeneXpert MRSA Assay (FDA     approved for NASAL specimens     only), is one component of a     comprehensive MRSA colonization     surveillance program. It is not     intended to diagnose MRSA     infection nor to guide or     monitor treatment for     MRSA infections.     RESULT CALLED TO, READ BACK BY AND VERIFIED WITH:     K PORTER RN [id number] 06/12/12 A BROWNING     Labs: Basic Metabolic Panel:  Recent Labs Lab  06/12/12 1636 06/13/12 0833 06/15/12 0540  NA 138 142 141  K 4.8 4.4 3.6  CL 103 110 109  CO2 27 26 25   GLUCOSE 128* 95 89  BUN 40* 32* 15  CREATININE 0.60 0.66 0.67  CALCIUM 8.2* 7.6* 7.1*   Liver Function Tests:  Recent Labs Lab 06/12/12 1636 06/15/12 0540  AST 24 28  ALT 10 12  ALKPHOS 97 75  BILITOT 0.4 0.6  PROT 6.1 5.6*  ALBUMIN 2.6* 2.3*    Recent Labs Lab 06/12/12 1636  LIPASE 65*    Recent Labs Lab 06/12/12 1732 06/15/12 0540  AMMONIA 95* 72*   CBC:  Recent Labs Lab 06/12/12 1636  06/13/12 0833 06/13/12 2023 06/14/12 0735 06/14/12 1725 06/15/12 0540  WBC 14.2*  --  10.7* 10.2 9.6 9.6 7.7  NEUTROABS 5.1  --   --   --   --   --   --   HGB 8.8*  < > 8.1* 8.5* 8.1* 9.3* 9.5*  HCT 26.6*  < > 24.1* 24.8* 24.6* 27.1* 28.3*  MCV 87.8  --  84.0 87.6 87.9 85.8 87.1  PLT 154  --  110* 97* 92* 89* 85*  < > = values in this interval not displayed. Cardiac Enzymes: No results found for this basename: CKTOTAL, CKMB, CKMBINDEX, TROPONINI,  in the last 168 hours BNP: BNP (last 3 results) No results found for this basename: PROBNP,  in the last 8760 hours CBG:  Recent Labs Lab 06/14/12 0827 06/14/12 1142 06/14/12 1710 06/14/12 2018 06/15/12 0833  GLUCAP 77 85 76 101* 88       Signed:  Chrystel Barefield  [facility] 06/15/2012, 11:18 AM

## 2012-06-15 NOTE — Progress Notes (Signed)
Patient ambulated  Along [address] hallway using a Siddique HR 80s, spo2 100% denied SOB.

## 2012-06-15 NOTE — Progress Notes (Signed)
DC`d instructions given, patient verbalized understanding.Pt escorted out on wheelchair accompanied by wife.

## 2012-06-17 LAB — TYPE AND SCREEN
Unit division: 0
Unit division: 0

## 2012-06-17 LAB — GLUCOSE, CAPILLARY
Glucose-Capillary: 78 mg/dL (ref 70–99)
Glucose-Capillary: 98 mg/dL (ref 70–99)

## 2012-11-21 HISTORY — PX: OTHER SURGICAL HISTORY: SHX169

## 2012-12-06 ENCOUNTER — Non-Acute Institutional Stay (SKILLED_NURSING_FACILITY): Payer: Managed Care, Other (non HMO) | Admitting: Internal Medicine

## 2012-12-06 ENCOUNTER — Other Ambulatory Visit: Payer: Self-pay | Admitting: *Deleted

## 2012-12-06 DIAGNOSIS — S82899A Other fracture of unspecified lower leg, initial encounter for closed fracture: Secondary | ICD-10-CM

## 2012-12-06 DIAGNOSIS — E1149 Type 2 diabetes mellitus with other diabetic neurological complication: Secondary | ICD-10-CM

## 2012-12-06 DIAGNOSIS — K7460 Unspecified cirrhosis of liver: Secondary | ICD-10-CM

## 2012-12-06 DIAGNOSIS — S82891A Other fracture of right lower leg, initial encounter for closed fracture: Secondary | ICD-10-CM

## 2012-12-06 DIAGNOSIS — R269 Unspecified abnormalities of gait and mobility: Secondary | ICD-10-CM

## 2012-12-06 DIAGNOSIS — K7689 Other specified diseases of liver: Secondary | ICD-10-CM

## 2012-12-06 MED ORDER — OXYCODONE HCL 5 MG PO CAPS: ORAL_CAPSULE | ORAL | Status: AC

## 2012-12-06 NOTE — Progress Notes (Signed)
Patient ID: Nicholas Petersen, male   DOB: 11/06/1950, 62 y.o.   MRN: [id number] Facility; [facility] Chief complaint; admission to SNF for post they have [facility] August 21 through September 4 History; this is a medically complex 62 year old man who suffered a fall while getting out of bed. He suffered a right bimalleolar fracture and underwent an open reduction and internal fixation and repair of the deltoid ligament. This was done on August 21. The patient is non-weightbearing on the right leg. The patient tells me that he suffers roughly 2 falls per week. Most of these are outside and appeared to involve loss of balance. He does not describe presyncope or orthostatic symptoms. He does tell me that he was seen by a neurologist and workup for ataxia. He describes some form of abnormality on an MRI of the brain. However I am not certain in discussion with him what this involves the.  Past Medical History  Diagnosis Date  . Arthritis   . Diabetes mellitus   . Hypertension   . MI, old   . Hyperlipidemia   . Sleep apnea   . Narcolepsy   . Erosive gastritis with hemorrhage 06/13/2012  . Marland Kitchen Esophageal varices with hemorrhage NASH with Portal hypertension and Esophageal Varicies Obstructive sleep apnea with CPAP Rheumatoid arthritis  Narcolepsy  06/13/2012    Past Surgical History  Procedure Laterality Date  . Cataract extraction    . Joint replacement    . Esophagogastroduodenoscopy N/A 06/13/2012    Procedure: ESOPHAGOGASTRODUODENOSCOPY (EGD);  Surgeon: Iva Boop, MD;  Location: [facility] ENDOSCOPY;  Service: Endoscopy;  Laterality: N/A;   Medications; Celexa 10 mg by mouth each bedtime, fluticasone 2 sprays nasally at bedtime, Lasix 80 mg by mouth twice a day, lactulose 15 mL as by mouth twice a day, Provigil 200 mg by mouth twice a day, oxycodone 5 mg every 4 when necessary, Protonix 40 mg by mouth every afternoon, prednisone on a when necessary basis for flareups of  rheumatoid arthritis, Lyrica 50 mg 2 capsules every morning and 4 capsules at bedtime, propranolol 10 mg twice a day, Exelon 9.5 transdermal, Ronald lactone 50 mg by mouth twice a day, Cialis 5 mg by mouth daily   Socially; patient tells me he lives in [place] with his wife. Occasionally uses a cane  Review of systems Respiratory; no cough no sputum Cardiac no exertional chest pain GI no nausea vomiting or dysphagia. Musculoskeletal no pain no current activity of his rheumatoid arthritis Neurologic; describes significant dysesthesia involving both of his feet as well as "ataxia". I'm not sure of his ataxia is all diabetic related or not   Physical examination Gen. patient is not in any distress Respiratory clear entry bilaterally no wheezing Cardiac heart sounds are normal no murmurs Abdomen; his liver and spleen are not palpable there is no ascites. There is no stigmata of chronic liver disease Musculoskeletal no active joints are present. He appears to have a left total knee replacement Neurologic; surprisingly his reflexes are intact including his knee jerks.  Impression/plan #1 admission and surgery for a right bimalleolar fracture status post ORIF. His leg is in a house with an Ace wrap. I will not be able to look at his wound. Presumably he has orthopedic followup. #2 apparent Elita Boone with portal hypertension; this really has no overt findings at the bedside. He will certainly need his lab work checked #3 rheumatoid arthritis he tells me he is on Biologics at home which will  be due next week. #4 type 2 diabetes with diabetic neuropathy. He has chronic dysesthesias in his legs. He should be checked for orthostatic hypotension #5 gait ataxia with recurrent falls up to 2 times per week. Whether there is more to this than diabetic neuropathy of I am uncertain. He does describe a neurologic workup with an MRI. I have no further information on this  The major issue appears to be recurrent  falls. I will try to followup on some of this next week. We'll set up the Biologics which will need to be brought in at home

## 2012-12-09 ENCOUNTER — Non-Acute Institutional Stay (SKILLED_NURSING_FACILITY): Payer: Managed Care, Other (non HMO) | Admitting: Adult Health

## 2012-12-09 ENCOUNTER — Encounter: Payer: Self-pay | Admitting: Adult Health

## 2012-12-09 DIAGNOSIS — M810 Age-related osteoporosis without current pathological fracture: Secondary | ICD-10-CM | POA: Insufficient documentation

## 2012-12-09 DIAGNOSIS — F0150 Vascular dementia without behavioral disturbance: Secondary | ICD-10-CM | POA: Insufficient documentation

## 2012-12-09 DIAGNOSIS — R188 Other ascites: Secondary | ICD-10-CM

## 2012-12-09 DIAGNOSIS — G609 Hereditary and idiopathic neuropathy, unspecified: Secondary | ICD-10-CM

## 2012-12-09 DIAGNOSIS — F329 Major depressive disorder, single episode, unspecified: Secondary | ICD-10-CM | POA: Insufficient documentation

## 2012-12-09 DIAGNOSIS — K5900 Constipation, unspecified: Secondary | ICD-10-CM

## 2012-12-09 DIAGNOSIS — J309 Allergic rhinitis, unspecified: Secondary | ICD-10-CM | POA: Insufficient documentation

## 2012-12-09 DIAGNOSIS — I8501 Esophageal varices with bleeding: Secondary | ICD-10-CM

## 2012-12-09 DIAGNOSIS — G629 Polyneuropathy, unspecified: Secondary | ICD-10-CM | POA: Insufficient documentation

## 2012-12-09 DIAGNOSIS — N400 Enlarged prostate without lower urinary tract symptoms: Secondary | ICD-10-CM

## 2012-12-09 DIAGNOSIS — M069 Rheumatoid arthritis, unspecified: Secondary | ICD-10-CM

## 2012-12-09 DIAGNOSIS — K7460 Unspecified cirrhosis of liver: Secondary | ICD-10-CM

## 2012-12-09 MED ORDER — CALCIUM CARBONATE-VITAMIN D 600-400 MG-UNIT PO TABS: 1.0000 | ORAL_TABLET | Freq: Two times a day (BID) | ORAL | Status: AC

## 2012-12-09 MED ORDER — ALENDRONATE SODIUM 70 MG PO TABS
70.0000 mg | ORAL_TABLET | ORAL | Status: AC
Start: 2012-12-09 — End: ?

## 2012-12-09 NOTE — Assessment & Plan Note (Signed)
Is without change in status; will continue exelon patch 9.5 mg daily and will monitor

## 2012-12-09 NOTE — Progress Notes (Signed)
Patient ID: Nicholas Petersen, male   DOB: 1950-09-09, 62 y.o.   MRN: [id number]  [facility]  Allergies  Allergen Reactions  . Aspirin Anaphylaxis and Swelling  . Nsaids Swelling  . Versed [Midazolam]     Makes me "jittery", did not sleep for 4 days, passed out, ended up in ICU     Chief Complaint  Patient presents with  . Acute Visit    patient concerns    HPI:   He has requested to be seen as he having left knee pain. He has had a knee replacement on the left in the past. He would like to try his prednisone taper to better manage the inflammation he feels is present at this time. He would also like his lactulose increase has he is not enough frequent enough stools.  Past Medical History  Diagnosis Date  . Arthritis   . Diabetes mellitus   . Hypertension   . MI, old   . Hyperlipidemia   . Sleep apnea   . Narcolepsy   . Erosive gastritis with hemorrhage 06/13/2012  . 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 Closed bimalleolar fracture of right ankle      Past Surgical History  Procedure Laterality Date  . Cataract extraction    . Joint replacement    . Esophagogastroduodenoscopy N/A 06/13/2012    Procedure: ESOPHAGOGASTRODUODENOSCOPY (EGD);  Surgeon: Iva Boop, MD;  Location: [facility] ENDOSCOPY;  Service: Endoscopy;  Laterality: N/A;  . Right ankle orif  11-21-12     VITAL SIGNS BP 125/76  Pulse 90  Ht 6\' 4"  (1.93 m)  Wt 279 lb (126.554 kg)  BMI 33.98 kg/m2   Patient's Medications  New Prescriptions   No medications on file  Previous Medications   CITALOPRAM (CELEXA) 10 MG TABLET    Take 10 mg by mouth daily.    DIPHENHYDRAMINE (BENADRYL) 50 MG TABLET    Take 25 mg by mouth every 6 (six) hours as needed for itching.    FLUTICASONE (FLONASE) 50 MCG/ACT NASAL SPRAY    Place 2 sprays into the nose at bedtime and may repeat dose one time if needed.   FUROSEMIDE (LASIX) 80 MG TABLET    Take 80 mg by mouth 2 (two) times daily.    MODAFINIL (PROVIGIL) 200 MG  TABLET    Take 200 mg by mouth 2 (two) times daily with breakfast and lunch.   OXYCODONE (OXY-IR) 5 MG CAPSULE    Take one tablet by mouth four times daily as needed for pain   PANTOPRAZOLE (PROTONIX) 40 MG TABLET    Take 1 tablet (40 mg total) by mouth daily at 6 (six) AM.   PREDNISONE (DELTASONE) 10 MG TABLET    Take 10 mg by mouth daily as needed. For RA flare up: 60 mg for one day; then 50 mg for one day; then 40 mg for one day; then 30 mg for one day; then 20 mg for one day; then 10 mg for one day then stop.   PREGABALIN (LYRICA) 100 MG CAPSULE    Take 100 mg by mouth 2 (two) times daily. TAKE 100 MG IN THE AM AND 200 MG IN THE PM   PROPRANOLOL (INDERAL) 10 MG TABLET    Take 10 mg by mouth 2 (two) times daily.   RIVASTIGMINE (EXELON) 9.5 MG/24HR    Place 1 patch onto the skin every morning.   SPIRONOLACTONE (ALDACTONE) 50 MG TABLET    Take 50 mg by mouth 2 (  two) times daily.    TADALAFIL (CIALIS) 5 MG TABLET    Take 5 mg by mouth daily.  Modified Medications   Modified Medication Previous Medication   LACTULOSE (CHRONULAC) 10 GM/15ML SOLUTION lactulose (CHRONULAC) 10 GM/15ML solution      Take 15 mLs by mouth 2 (two) times daily.    Take 30 mLs (20 g total) by mouth 2 (two) times daily.  Discontinued Medications   MUPIROCIN OINTMENT (BACTROBAN) 2 %    Apply 1 application topically 2 (two) times daily. Can finish off tube from hospital stay   SODIUM CHLORIDE (OCEAN) 0.65 % NASAL SPRAY    Place 1 spray into the nose as needed for congestion.    SIGNIFICANT DIAGNOSTIC EXAMS   LABS REVIEWED:   None available    Review of Systems  Constitutional: Negative for malaise/fatigue.  Respiratory: Negative for cough and shortness of breath.   Cardiovascular: Negative for chest pain and leg swelling.  Gastrointestinal: Positive for constipation. Negative for heartburn and abdominal pain.  Musculoskeletal: Positive for joint pain. Negative for myalgias.       Right knee pain; left ankle  fracture pain is being managed  Skin: Negative.   Neurological: Negative for headaches.  Psychiatric/Behavioral: Negative for depression. The patient does not have insomnia.    Physical Exam  Constitutional: He is oriented to person, place, and time. He appears well-developed and well-nourished.  Obese   Neck: Neck supple. No JVD present. No thyromegaly present.  Cardiovascular: Normal rate and regular rhythm.   Respiratory: Effort normal and breath sounds normal. No respiratory distress. He has no wheezes.  GI: Soft. Bowel sounds are normal. He exhibits no distension. There is no tenderness.  Musculoskeletal: He exhibits no edema.  Has crepitus and pain to palpation in the left knee right ankle is soft ace cast and blue shoe  Neurological: He is alert and oriented to person, place, and time.  Skin: Skin is warm and dry.  Psychiatric: He has a normal mood and affect.       ASSESSMENT/ PLAN:   Esophageal varices with hemorrhage Will continue protonix 40 mg daily and will continue inderal 10 mg twice daily and will monitor   Cirrhosis of liver without mention of alcohol He states he was taking lactulose 30 cc twice daily will check an ammonia level in the am and will treat as indicated.   Depression He is stable will continue celexa 10 mg daily and Provigil 200 mg twice daily to help with significant lethargy.    RA (rheumatoid arthritis) Will being his prednisone for acute flare ups will use prednisone taper from 60 mg daily will decrease by 10 mg daily until complete will begin this tape in the am for his left knee pain and will monitor his status   Vascular dementia Is without change in status; will continue exelon patch 9.5 mg daily and will monitor   Ascites Will continue his lasix 80 mg twice daily and aldactone 50 mg twice daily and will monitor his status   Peripheral neuropathy Is stable will continue lyrica 100 mg in the am and 200 mg in the pm and will monitor    Allergic rhinitis Will continue flonase nightly   BPH (benign prostatic hyperplasia) Will continue cilais 5 mg daily he also takes this medication for his ed.   Constipation Is worse; will begin senna s 2 tabs daily as needed and will monitor   Osteoporosis, unspecified Will fosamax 70 mg weekly and will  begin ca++ with d twice daily and will monitor his status      Time spent with patient 50 minutes

## 2012-12-09 NOTE — Assessment & Plan Note (Signed)
Will fosamax 70 mg weekly and will begin ca++ with d twice daily and will monitor his status

## 2012-12-09 NOTE — Assessment & Plan Note (Signed)
Is stable will continue lyrica 100 mg in the am and 200 mg in the pm and will monitor

## 2012-12-09 NOTE — Assessment & Plan Note (Signed)
Will being his prednisone for acute flare ups will use prednisone taper from 60 mg daily will decrease by 10 mg daily until complete will begin this tape in the am for his left knee pain and will monitor his status

## 2012-12-09 NOTE — Assessment & Plan Note (Signed)
He states he was taking lactulose 30 cc twice daily will check an ammonia level in the am and will treat as indicated.

## 2012-12-09 NOTE — Assessment & Plan Note (Signed)
Will continue flonase nightly

## 2012-12-09 NOTE — Assessment & Plan Note (Signed)
Will continue cilais 5 mg daily he also takes this medication for his ed.

## 2012-12-09 NOTE — Assessment & Plan Note (Deleted)
He is stable will continue inderal 10 mg twice daily and will monitor

## 2012-12-09 NOTE — Assessment & Plan Note (Signed)
Is worse; will begin senna s 2 tabs daily as needed and will monitor

## 2012-12-09 NOTE — Assessment & Plan Note (Addendum)
Will continue protonix 40 mg daily and will continue inderal 10 mg twice daily and will monitor

## 2012-12-09 NOTE — Assessment & Plan Note (Signed)
He is stable will continue celexa 10 mg daily and Provigil 200 mg twice daily to help with significant lethargy.

## 2012-12-09 NOTE — Assessment & Plan Note (Signed)
Will continue his lasix 80 mg twice daily and aldactone 50 mg twice daily and will monitor his status

## 2013-10-13 IMAGING — US US PARACENTESIS
1 series · 10 of 10 positions shown · non-contrast
Comparison: None

CLINICAL DATA: Abdominal ascites

ULTRASOUND GUIDED PARACENTESIS

[Series 1: us paracentesis · 0.31mm/px · 10 of 10 slices shown]
[im 1/10]
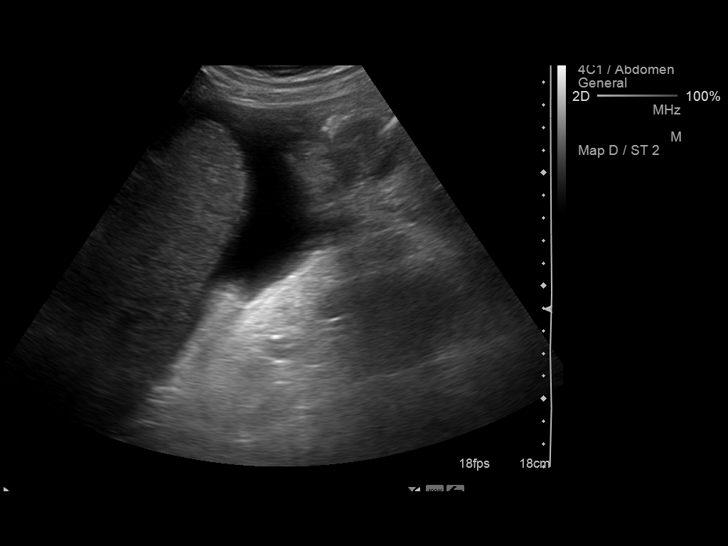
[im 2/10]
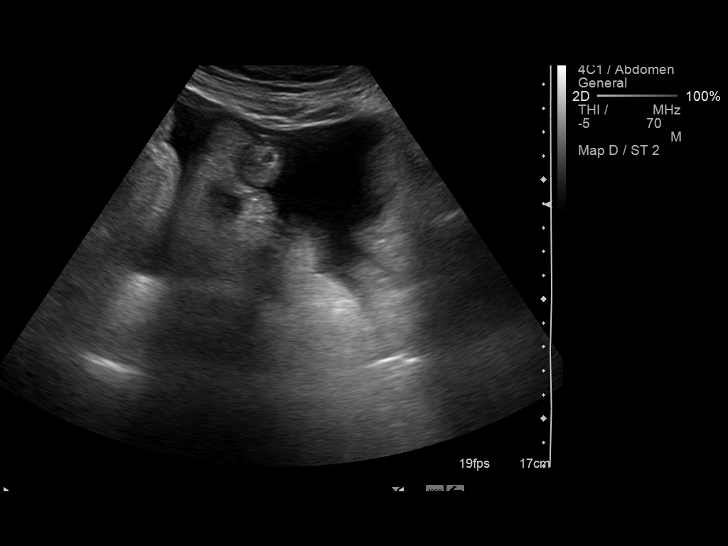
[im 3/10]
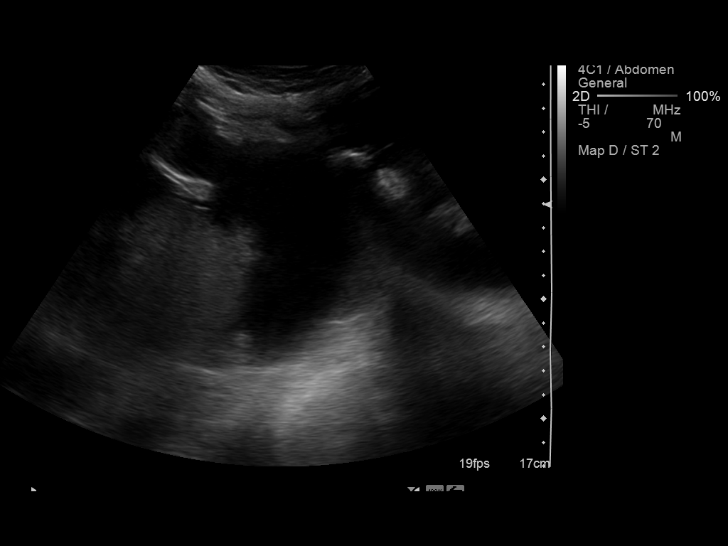
[im 4/10]
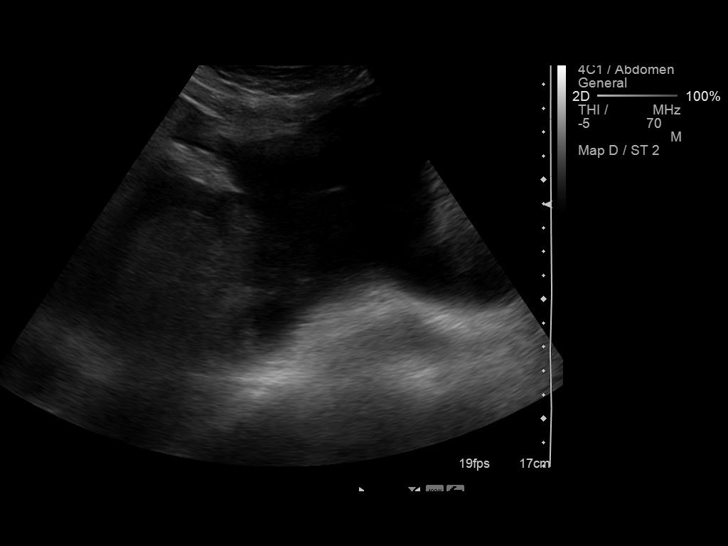
[im 5/10]
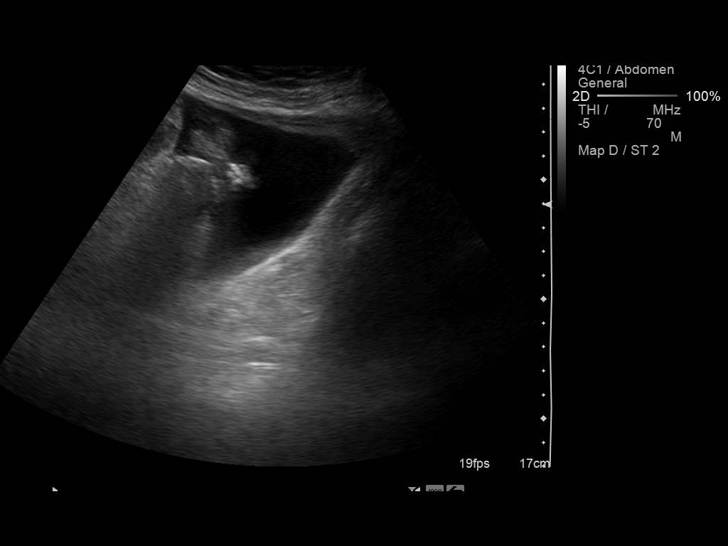
[im 6/10]
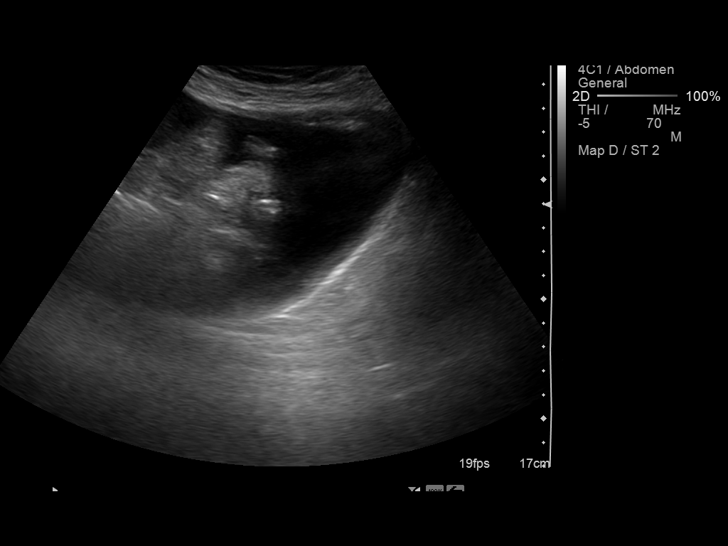
[im 7/10]
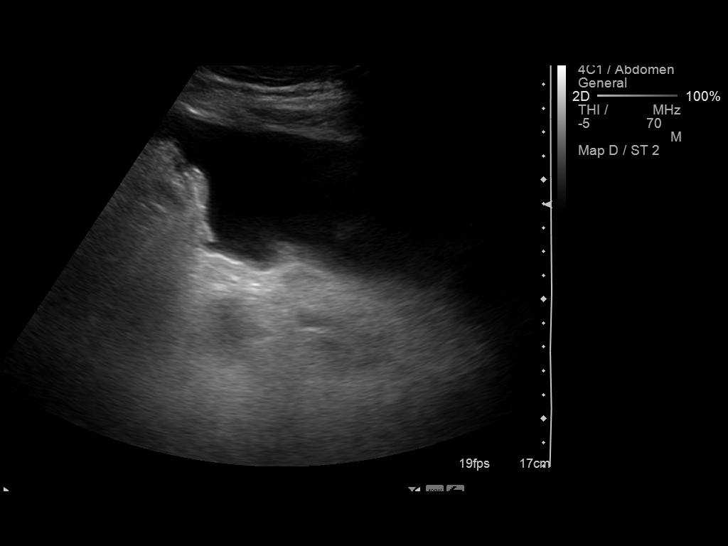
[im 8/10]
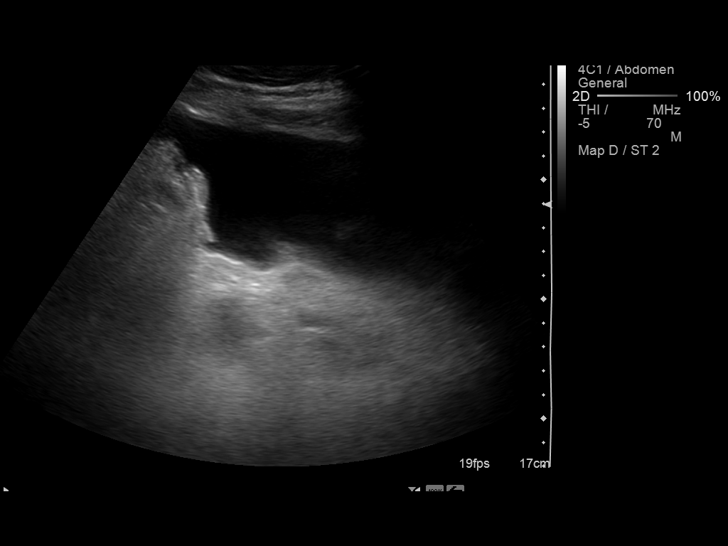
[im 9/10]
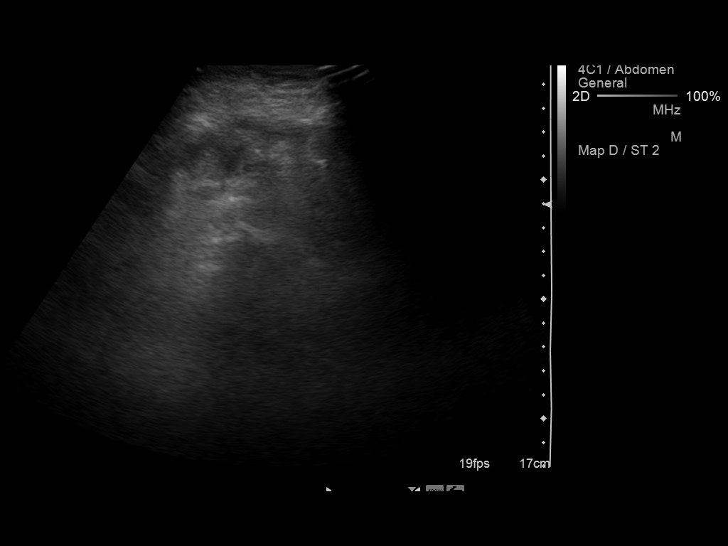
[im 10/10]
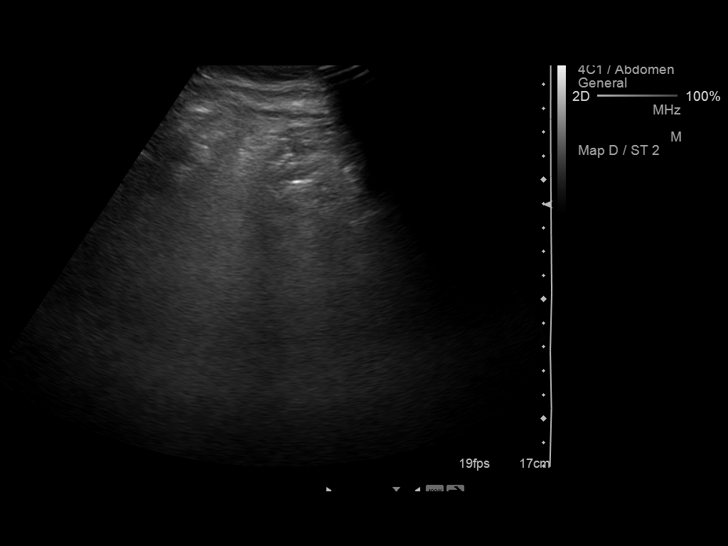

[10 of 10 positions shown; findings below may reference images not displayed]

An ultrasound guided paracentesis was thoroughly discussed with the
patient and questions answered.  The benefits, risks, alternatives
and complications were also discussed.  The patient understands and
wishes to proceed with the procedure.  Written consent was
obtained.

Ultrasound was performed to localize and mark an adequate pocket of
fluid in the left lower quadrant of the abdomen.  The area was then
prepped and draped in the normal sterile fashion.  1% Lidocaine was
used for local anesthesia.  Under ultrasound guidance a 19 gauge
Yueh catheter was introduced.  Paracentesis was performed.  The
catheter was removed and a dressing applied.

Complications:  None
FINDINGS: A total of approximately 3.3 liters of yellow fluid was
removed.  A fluid sample was not sent for laboratory analysis.
IMPRESSION: Successful ultrasound guided paracentesis yielding 3.3 liters of
ascites.

Read by: Stenger, Toyin.-M HAMZA

## 2014-11-01 ENCOUNTER — Emergency Department (HOSPITAL_BASED_OUTPATIENT_CLINIC_OR_DEPARTMENT_OTHER): Payer: Managed Care, Other (non HMO)

## 2014-11-01 ENCOUNTER — Emergency Department (HOSPITAL_BASED_OUTPATIENT_CLINIC_OR_DEPARTMENT_OTHER)
Admission: EM | Admit: 2014-11-01 | Discharge: 2014-11-01 | Disposition: A | Discharge status: Home / Self Care | Payer: Managed Care, Other (non HMO) | Attending: Emergency Medicine | Admitting: Emergency Medicine

## 2014-11-01 ENCOUNTER — Encounter (HOSPITAL_BASED_OUTPATIENT_CLINIC_OR_DEPARTMENT_OTHER): Payer: Self-pay | Admitting: *Deleted

## 2014-11-01 DIAGNOSIS — R51 Headache: Secondary | ICD-10-CM | POA: Insufficient documentation

## 2014-11-01 DIAGNOSIS — Z8719 Personal history of other diseases of the digestive system: Secondary | ICD-10-CM | POA: Insufficient documentation

## 2014-11-01 DIAGNOSIS — Z79899 Other long term (current) drug therapy: Secondary | ICD-10-CM | POA: Insufficient documentation

## 2014-11-01 DIAGNOSIS — Z8669 Personal history of other diseases of the nervous system and sense organs: Secondary | ICD-10-CM | POA: Diagnosis not present

## 2014-11-01 DIAGNOSIS — E119 Type 2 diabetes mellitus without complications: Secondary | ICD-10-CM | POA: Diagnosis not present

## 2014-11-01 DIAGNOSIS — R0602 Shortness of breath: Secondary | ICD-10-CM | POA: Insufficient documentation

## 2014-11-01 DIAGNOSIS — R42 Dizziness and giddiness: Secondary | ICD-10-CM | POA: Insufficient documentation

## 2014-11-01 DIAGNOSIS — Z8781 Personal history of (healed) traumatic fracture: Secondary | ICD-10-CM | POA: Diagnosis not present

## 2014-11-01 DIAGNOSIS — R110 Nausea: Secondary | ICD-10-CM | POA: Diagnosis not present

## 2014-11-01 DIAGNOSIS — I252 Old myocardial infarction: Secondary | ICD-10-CM | POA: Diagnosis not present

## 2014-11-01 DIAGNOSIS — I10 Essential (primary) hypertension: Secondary | ICD-10-CM | POA: Insufficient documentation

## 2014-11-01 DIAGNOSIS — R0600 Dyspnea, unspecified: Secondary | ICD-10-CM | POA: Diagnosis not present

## 2014-11-01 DIAGNOSIS — R531 Weakness: Secondary | ICD-10-CM | POA: Diagnosis present

## 2014-11-01 DIAGNOSIS — M6281 Muscle weakness (generalized): Secondary | ICD-10-CM | POA: Diagnosis not present

## 2014-11-01 LAB — URINALYSIS, ROUTINE W REFLEX MICROSCOPIC
Bilirubin Urine: NEGATIVE
Glucose, UA: NEGATIVE mg/dL
HGB URINE DIPSTICK: NEGATIVE
Ketones, ur: NEGATIVE mg/dL
LEUKOCYTES UA: NEGATIVE
NITRITE: NEGATIVE
PROTEIN: NEGATIVE mg/dL
Specific Gravity, Urine: 1.009 (ref 1.005–1.030)
UROBILINOGEN UA: 0.2 mg/dL (ref 0.0–1.0)
pH: 5.5 (ref 5.0–8.0)

## 2014-11-01 LAB — CBC WITH DIFFERENTIAL/PLATELET
BASOS ABS: 0 10*3/uL (ref 0.0–0.1)
BASOS PCT: 0 % (ref 0–1)
EOS ABS: 9.2 10*3/uL — AB (ref 0.0–0.7)
Eosinophils Relative: 57 % — ABNORMAL HIGH (ref 0–5)
HCT: 40.6 % (ref 39.0–52.0)
HEMOGLOBIN: 14.3 g/dL (ref 13.0–17.0)
LYMPHS ABS: 2 10*3/uL (ref 0.7–4.0)
Lymphocytes Relative: 13 % (ref 12–46)
MCH: 30 pg (ref 26.0–34.0)
MCHC: 35.2 g/dL (ref 30.0–36.0)
MCV: 85.1 fL (ref 78.0–100.0)
Monocytes Absolute: 0.4 10*3/uL (ref 0.1–1.0)
Monocytes Relative: 2 % — ABNORMAL LOW (ref 3–12)
NEUTROS PCT: 28 % — AB (ref 43–77)
Neutro Abs: 4.6 10*3/uL (ref 1.7–7.7)
PLATELETS: 103 10*3/uL — AB (ref 150–400)
RBC: 4.77 MIL/uL (ref 4.22–5.81)
RDW: 15.4 % (ref 11.5–15.5)
WBC: 16.2 10*3/uL — AB (ref 4.0–10.5)

## 2014-11-01 LAB — I-STAT CHEM 8, ED
BUN: 12 mg/dL (ref 6–20)
CALCIUM ION: 1.08 mmol/L — AB (ref 1.13–1.30)
CHLORIDE: 99 mmol/L — AB (ref 101–111)
CREATININE: 1 mg/dL (ref 0.61–1.24)
GLUCOSE: 125 mg/dL — AB (ref 65–99)
HCT: 42 % (ref 39.0–52.0)
Hemoglobin: 14.3 g/dL (ref 13.0–17.0)
Potassium: 3.7 mmol/L (ref 3.5–5.1)
Sodium: 138 mmol/L (ref 135–145)
TCO2: 26 mmol/L (ref 0–100)

## 2014-11-01 LAB — TROPONIN I: Troponin I: <0.03 ng/mL (ref <0.031)

## 2014-11-01 LAB — D-DIMER, QUANTITATIVE (NOT AT ARMC): D DIMER QUANT: 1.17 ug{FEU}/mL — AB (ref 0.00–0.48)

## 2014-11-01 MED ORDER — SODIUM CHLORIDE 0.9 % IV BOLUS (SEPSIS)
500.0000 mL | Freq: Once | INTRAVENOUS | Status: AC
  Administered 2014-11-01: 500 mL via INTRAVENOUS

## 2014-11-01 MED ORDER — IOHEXOL 350 MG/ML SOLN
100.0000 mL | Freq: Once | INTRAVENOUS | Status: AC | PRN
  Administered 2014-11-01: 100 mL via INTRAVENOUS

## 2014-11-01 NOTE — ED Provider Notes (Signed)
CSN: [id number]     Arrival date & time 11/01/14  1807 History  This chart was scribed for Blane Ohara, MD by Budd Palmer, ED Scribe. This patient was seen in room MH09/MH09 and the patient's care was started at 7:50 PM.     Chief Complaint  Patient presents with  . Weakness   The history is provided by the patient. No language interpreter was used.   HPI Comments: Nicholas Petersen is a 64 y.o. male who presents to the Emergency Department complaining of worsening weakness onset 1 week ago. He reports associated lightheadedness, lethargy, HA, SOB, and nausea. He also reports low BP and a light cough in the mornings. He has been to his PCP clinic and got his hemoglobin checked, which is when he was referred to the ED. He has a PSHx of getting a broken sowing needle removed from his foot about 2 weeks ago, when he was prescribed clindamycin. He notes associated drainage. He is taking diuretics. He has a PMHx of arthritis, fluid retention, and a minor MI. He denies a PMHx of PE. Pt denies bloody stool, abdominal pain, diarrhea, fever, and vomiting  Past Medical History  Diagnosis Date  . Arthritis   . Diabetes mellitus   . Hypertension   . MI, old   . Hyperlipidemia   . Sleep apnea   . Narcolepsy   . Erosive gastritis with hemorrhage 06/13/2012  . 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 Closed bimalleolar fracture of right ankle    Past Surgical History  Procedure Laterality Date  . Cataract extraction    . Joint replacement    . Esophagogastroduodenoscopy N/A 06/13/2012    Procedure: ESOPHAGOGASTRODUODENOSCOPY (EGD);  Surgeon: Iva Boop, MD;  Location: [facility] ENDOSCOPY;  Service: Endoscopy;  Laterality: N/A;  . Right ankle orif  11-21-12   History reviewed. No pertinent family history. History  Substance Use Topics  . Smoking status: Never Smoker   . Smokeless tobacco: Not on file  . Alcohol Use: No    Review of Systems  Respiratory: Positive for shortness of breath.    Gastrointestinal: Positive for nausea.  Skin: Positive for wound.  Neurological: Positive for weakness, light-headedness and headaches.  All other systems reviewed and are negative.   Allergies  Aspirin; Nsaids; and Versed  Home Medications   Prior to Admission medications   Medication Sig Start Date End Date Taking? Authorizing Provider  alendronate (FOSAMAX) 70 MG tablet Take 1 tablet (70 mg total) by mouth every 7 (seven) days. Take with a full glass of water on an empty stomach. 12/09/12   Sharee Holster, NP  Calcium Carbonate-Vitamin D (CALTRATE 600+D) 600-400 MG-UNIT per tablet Take 1 tablet by mouth 2 (two) times daily. 12/09/12   Sharee Holster, NP  citalopram (CELEXA) 10 MG tablet Take 10 mg by mouth daily.     Historical Provider, MD  diphenhydrAMINE (BENADRYL) 50 MG tablet Take 25 mg by mouth every 6 (six) hours as needed for itching.     Historical Provider, MD  fluticasone (FLONASE) 50 MCG/ACT nasal spray Place 2 sprays into the nose at bedtime and may repeat dose one time if needed.    Historical Provider, MD  furosemide (LASIX) 80 MG tablet Take 80 mg by mouth 2 (two) times daily.     Historical Provider, MD  lactulose (CHRONULAC) 10 GM/15ML solution Take 15 mLs by mouth 2 (two) times daily. 06/15/12   Calvert Cantor, MD  modafinil (PROVIGIL) 200 MG tablet Take  200 mg by mouth 2 (two) times daily with breakfast and lunch.    Historical Provider, MD  oxycodone (OXY-IR) 5 MG capsule Take one tablet by mouth four times daily as needed for pain 12/06/12   Tiffany L Reed, DO  pantoprazole (PROTONIX) 40 MG tablet Take 1 tablet (40 mg total) by mouth daily at 6 (six) AM. 06/15/12   Calvert Cantor, MD  predniSONE (DELTASONE) 10 MG tablet Take 10 mg by mouth daily as needed. For RA flare up: 60 mg for one day; then 50 mg for one day; then 40 mg for one day; then 30 mg for one day; then 20 mg for one day; then 10 mg for one day then stop.    Historical Provider, MD  pregabalin (LYRICA) 100 MG  capsule Take 100 mg by mouth 2 (two) times daily. TAKE 100 MG IN THE AM AND 200 MG IN THE PM    Historical Provider, MD  propranolol (INDERAL) 10 MG tablet Take 10 mg by mouth 2 (two) times daily.    Historical Provider, MD  rivastigmine (EXELON) 9.5 mg/24hr Place 1 patch onto the skin every morning.    Historical Provider, MD  spironolactone (ALDACTONE) 50 MG tablet Take 50 mg by mouth 2 (two) times daily.     Historical Provider, MD  tadalafil (CIALIS) 5 MG tablet Take 5 mg by mouth daily.    Historical Provider, MD   BP 102/54 mmHg  Pulse 74  Temp(Src) 98.5 F (36.9 C) (Oral)  Resp 18  Ht  (1.854 m)  Wt 273 lb (123.832 kg)  BMI 36.03 kg/m2  SpO2 93% Physical Exam  Constitutional: He is oriented to person, place, and time. He appears well-developed and well-nourished. No distress.  HENT:  Head: Normocephalic and atraumatic.  Mouth/Throat: Oropharynx is clear and moist.  Eyes: Conjunctivae and EOM are normal. Pupils are equal, round, and reactive to light.  Neck: Normal range of motion. Neck supple. No tracheal deviation present.  Cardiovascular: Normal rate, regular rhythm and normal heart sounds.   Pulmonary/Chest: Effort normal and breath sounds normal. No respiratory distress.  Lungs clear to auscultation  Abdominal: Soft.  Musculoskeletal: Normal range of motion.  2.5cm linear post-op incision. No pus drainage, surrounding erythema or induration. 2cm healing surgical wound on the dorsal right foot.  Neurological: He is alert and oriented to person, place, and time.  Skin: Skin is warm and dry.  Psychiatric: He has a normal mood and affect. His behavior is normal.  Nursing note and vitals reviewed.   ED Course  Procedures   DIAGNOSTIC STUDIES: Oxygen Saturation is 94% on RA, low by my interpretation.    COORDINATION OF CARE: 8:06 PM - Discussed diagnostic study results. Discussed plans to order IV fluids as well as further diagnostic studies and imaging. Pt advised  of plan for treatment and pt agrees.  Labs Review Labs Reviewed  CBC WITH DIFFERENTIAL/PLATELET - Abnormal; Notable for the following:    WBC 16.2 (*)    Platelets 103 (*)    Neutrophils Relative % 28 (*)    Monocytes Relative 2 (*)    Eosinophils Relative 57 (*)    Eosinophils Absolute 9.2 (*)    All other components within normal limits  D-DIMER, QUANTITATIVE (NOT AT [facility]) - Abnormal; Notable for the following:    D-Dimer, Quant 1.17 (*)    All other components within normal limits  I-STAT CHEM 8, ED - Abnormal; Notable for the following:    Chloride  99 (*)    Glucose, Bld 125 (*)    Calcium, Ion 1.08 (*)    All other components within normal limits  URINE CULTURE  TROPONIN I  URINALYSIS, ROUTINE W REFLEX MICROSCOPIC (NOT AT [facility])    Imaging Review Dg Chest 2 View  11/01/2014   CLINICAL DATA:  Dyspnea/short of breath. Lightheadedness. Weakness. Onset of symptoms 1 week ago.  EXAM: CHEST  2 VIEW  COMPARISON:  05/05/2010.  FINDINGS: Cardiopericardial silhouette within normal limits. Mediastinal contours normal. Trachea midline. No airspace disease or effusion. Monitoring leads project over the chest. Lower cervical ACDF.  IMPRESSION: No active cardiopulmonary disease.   Electronically Signed   By: Andreas Newport M.D.   On: 11/01/2014 20:34   Ct Angio Chest Pe W/cm &/or Wo Cm  11/01/2014   CLINICAL DATA:  Dyspnea and worsening weakness, onset 1 week ago.  EXAM: CT ANGIOGRAPHY CHEST WITH CONTRAST  TECHNIQUE: Multidetector CT imaging of the chest was performed using the standard protocol during bolus administration of intravenous contrast. Multiplanar CT image reconstructions and MIPs were obtained to evaluate the vascular anatomy.  CONTRAST:  OMNIPAQUE IOHEXOL 350 MG/ML SOLN  COMPARISON:  Radiographs 11/01/2014  FINDINGS: Cardiovascular: There is good opacification of the pulmonary arteries. There is no pulmonary embolism. The thoracic aorta is normal in caliber and intact.  Lungs:  Clear  Central airways: Patent  Effusions: None  Lymphadenopathy: Nonspecific mildly prominent nodes in the hila and mediastinum. No conclusively pathologic adenopathy.  Esophagus: Mild mural thickening, nonspecific.  Upper abdomen: Unremarkable  Musculoskeletal: No significant abnormality  Review of the MIP images confirms the above findings.  IMPRESSION: Negative for pulmonary embolism. There are nonspecific mildly prominent hilar and mediastinal nodes. There also is mild nonspecific esophageal mural thickening.   Electronically Signed   By: Ellery Plunk M.D.   On: 11/01/2014 21:45    EKG reviewed heart rate 62, normal QT, sinus, no acute ST elevation.   MDM   Final diagnoses:  Lightheaded  General weakness  Dyspnea    patient presents with general weakness.foot healing well and no signs of active infection, vitals normal the ER. Majority of symptoms are generalized and chronic however with significant medical history broad workup done for weakness. Patient has mild worsening shortness rest compared to previous CT Guss Bunde did not reveal embolism. Patient improved on reassessment IV fluids. No active bleeding in the ED. Patient stable and comfortable with close outpatient follow-up with his primary doctor.   Results and differential diagnosis were discussed with the patient/parent/guardian. Xrays were independently reviewed by myself.  Close follow up outpatient was discussed, comfortable with the plan.   Medications  sodium chloride 0.9 % bolus 500 mL (0 mLs Intravenous Stopped 11/01/14 2109)  iohexol (OMNIPAQUE) 350 MG/ML injection 100 mL (100 mLs Intravenous Contrast Given 11/01/14 2134)    Filed Vitals:   11/01/14 2048 11/01/14 2100 11/01/14 2139 11/01/14 2200  BP: 107/53 115/56 102/56 102/54  Pulse: 67 69 73 74  Temp:      TempSrc:      Resp: 19  18   Height:      Weight:      SpO2: 93% 93% 93% 93%    Final diagnoses:  Lightheaded  General weakness  Dyspnea       Blane Ohara, MD 11/03/14 1547

## 2014-11-01 NOTE — ED Notes (Addendum)
Pt reports that he was started on clindamycin after having foot surgery.  Reports burning in his abdomen, having weakness.  Hx of GI bleed and varices.  Reports recent increase in difficulty swallowing.

## 2014-11-01 NOTE — ED Notes (Addendum)
Patient reports feeling bad since beginning clindamycin for a foot wound.  Reports he currently has 10 days left of this.  Reports he is no longer going to continue taking this as he has been weak and states "my BP has been dropping when I stand up".  Reports weakness, difficulty swallowing and states "this is how I felt after I had esophageal varices.  Denies blood in stool or vomiting blood.

## 2014-11-01 NOTE — Discharge Instructions (Signed)
If you were given medicines take as directed.  If you are on coumadin or contraceptives realize their levels and effectiveness is altered by many different medicines.  If you have any reaction (rash, tongues swelling, other) to the medicines stop taking and see a physician.    If your blood pressure was elevated in the ER make sure you follow up for management with a primary doctor or return for chest pain, shortness of breath or stroke symptoms.  Please follow up as directed and return to the ER or see a physician for new or worsening symptoms.  Thank you. Filed Vitals:   11/01/14 1940 11/01/14 2048 11/01/14 2100 11/01/14 2139  BP: 135/55 107/53 115/56 102/56  Pulse: 63 67 69 73  Temp:      TempSrc:      Resp: Height:      Weight:      SpO2: 94% 93% 93% 93%

## 2014-11-01 NOTE — ED Notes (Signed)
Patient transported to CT 

## 2014-11-04 LAB — URINE CULTURE: Culture: 30000

## 2014-11-05 ENCOUNTER — Telehealth (HOSPITAL_COMMUNITY): Payer: Self-pay

## 2014-11-05 NOTE — Progress Notes (Signed)
ED Antimicrobial Stewardship Positive Culture Follow Up   Nicholas Petersen is an 64 y.o. male who presented to [facility] on 11/01/2014 with a chief complaint of  Chief Complaint  Patient presents with  . Weakness    Recent Results (from the past 720 hour(s))  Urine culture     Status: None   Collection Time: 11/01/14  7:00 PM  Result Value Ref Range Status   Specimen Description URINE, CLEAN CATCH  Final   Special Requests NONE  Final   Culture   Final    30,000 COLONIES/mL ESCHERICHIA COLI Confirmed Extended Spectrum Beta-Lactamase Producer (ESBL) Performed at [facility]    Report Status 11/04/2014 FINAL  Final   Organism ID, Bacteria ESCHERICHIA COLI  Final      Susceptibility   Escherichia coli - MIC*    AMPICILLIN >=32 RESISTANT Resistant     CEFAZOLIN >=64 RESISTANT Resistant     CEFTRIAXONE 8 RESISTANT Resistant     CIPROFLOXACIN <=0.25 SENSITIVE Sensitive     GENTAMICIN <=1 SENSITIVE Sensitive     IMIPENEM <=0.25 SENSITIVE Sensitive     NITROFURANTOIN <=16 SENSITIVE Sensitive     TRIMETH/SULFA >=320 RESISTANT Resistant     AMPICILLIN/SULBACTAM 4 SENSITIVE Sensitive     PIP/TAZO <=4 SENSITIVE Sensitive     * 30,000 COLONIES/mL ESCHERICHIA COLI    64yo M with a normal UA and 30,000 colonies E.Coli found on culture. Pt did not have any complaints of urinary symptoms and does not warrant treatment at this time.  ED Provider: Dierdre Forth, PA-C   [name] 11/05/2014, 8:25 AM Infectious Diseases Pharmacist Phone# [phone]

## 2014-11-05 NOTE — Telephone Encounter (Signed)
Post ED Visit - Positive Culture Follow-up  Culture report reviewed by antimicrobial stewardship pharmacist:  Wes Dulaney, Pharm.D., BCPS  Celedonio Miyamoto, Pharm.D., BCPS  Georgina Pillion, [address].D., BCPS  [place], [address].D., BCPS, AAHIVP  Estella Husk, Pharm.D., BCPS, AAHIVP  Elder Cyphers, [address].D., BCPS [place]  Positive urine culture  Per Dahlia Client Muthersbaugh PA no further patient follow-up is required at this time.  Ashley Jacobs 11/05/2014, 9:36 AM

## 2016-03-01 IMAGING — CR DG CHEST 2V
2 series · 2 of 2 positions shown · non-contrast
Comparison: 05/05/2010.

CLINICAL DATA: Dyspnea/short of breath. Lightheadedness. Weakness.
Onset of symptoms 1 week ago.

EXAM:
CHEST  2 VIEW

[w chest pa]
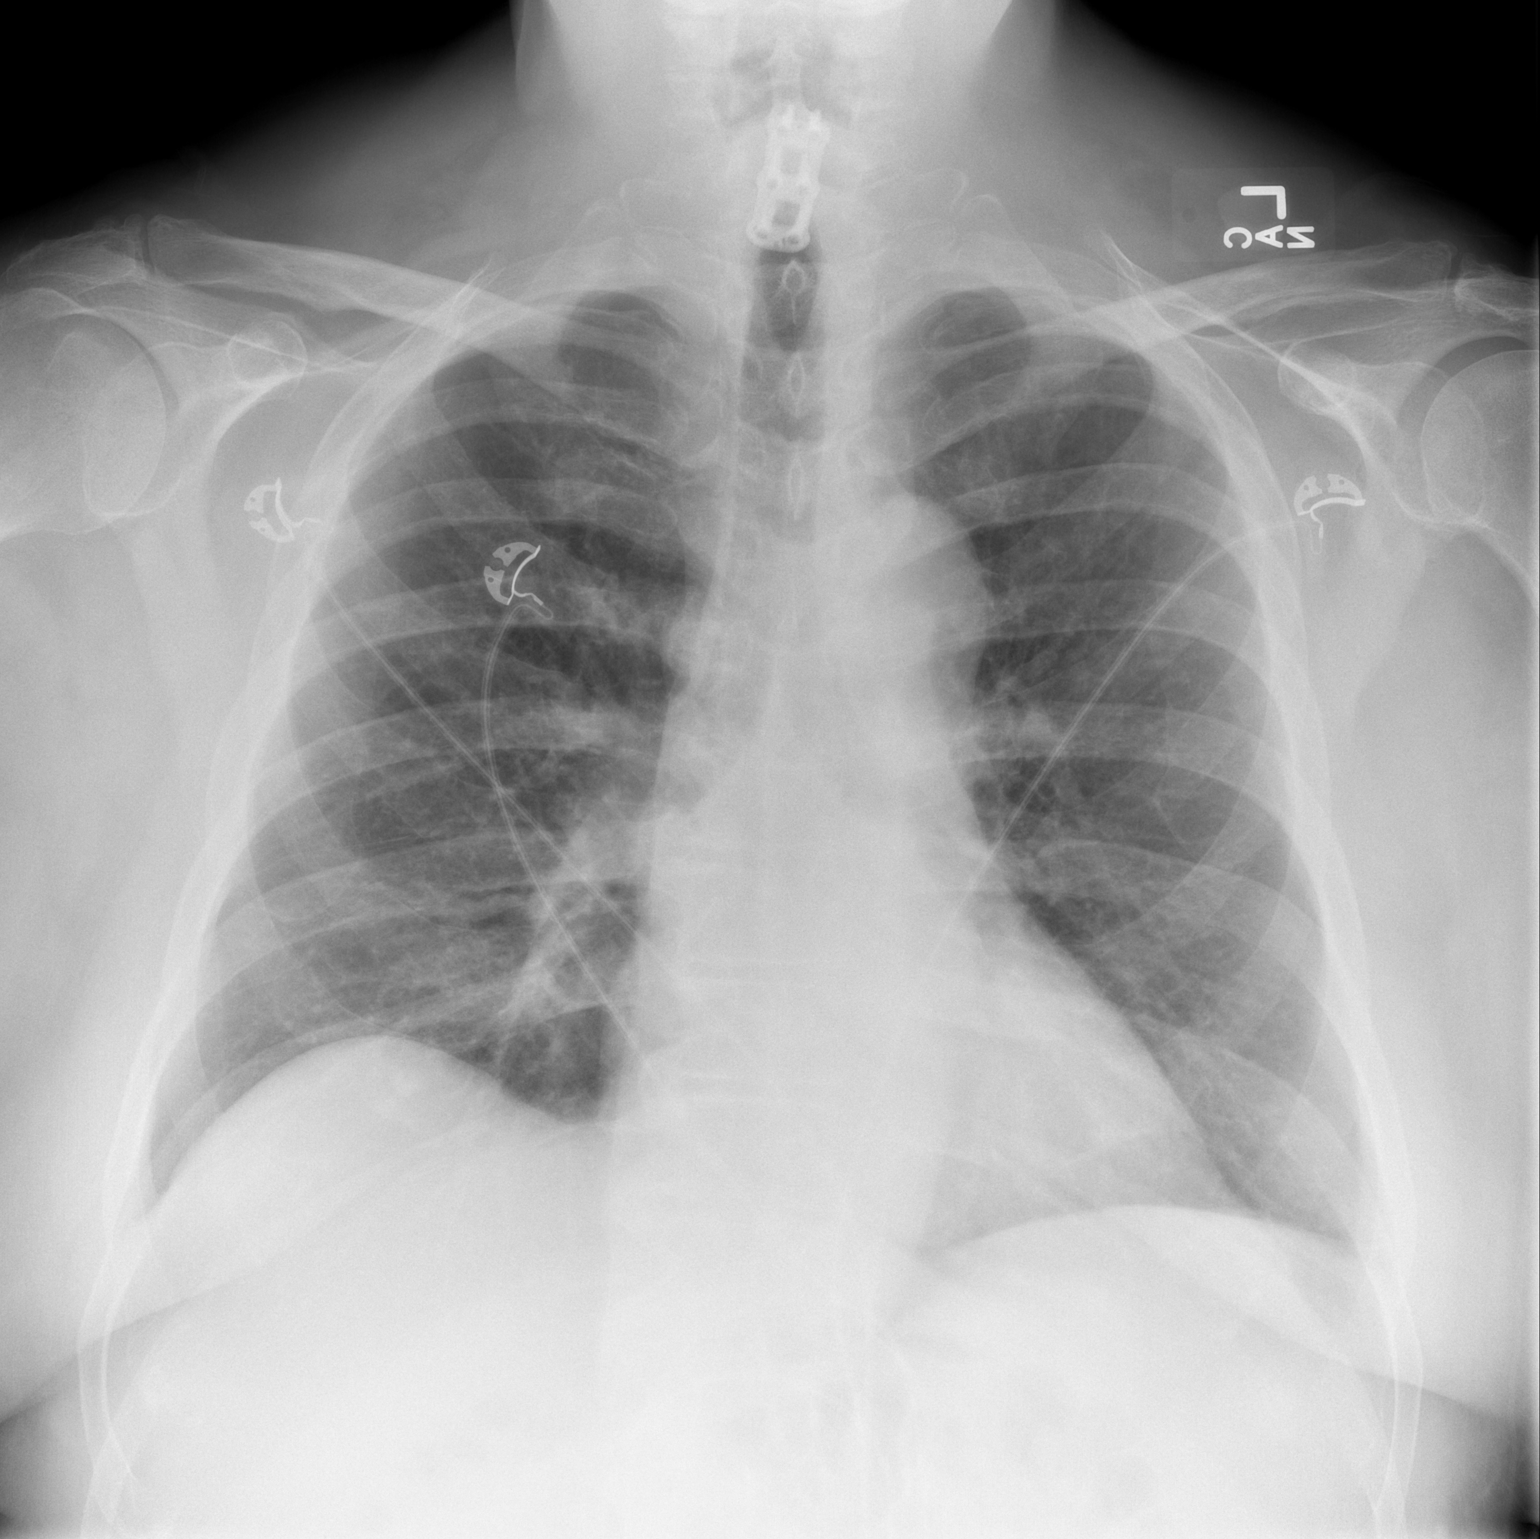

[w chest lat]
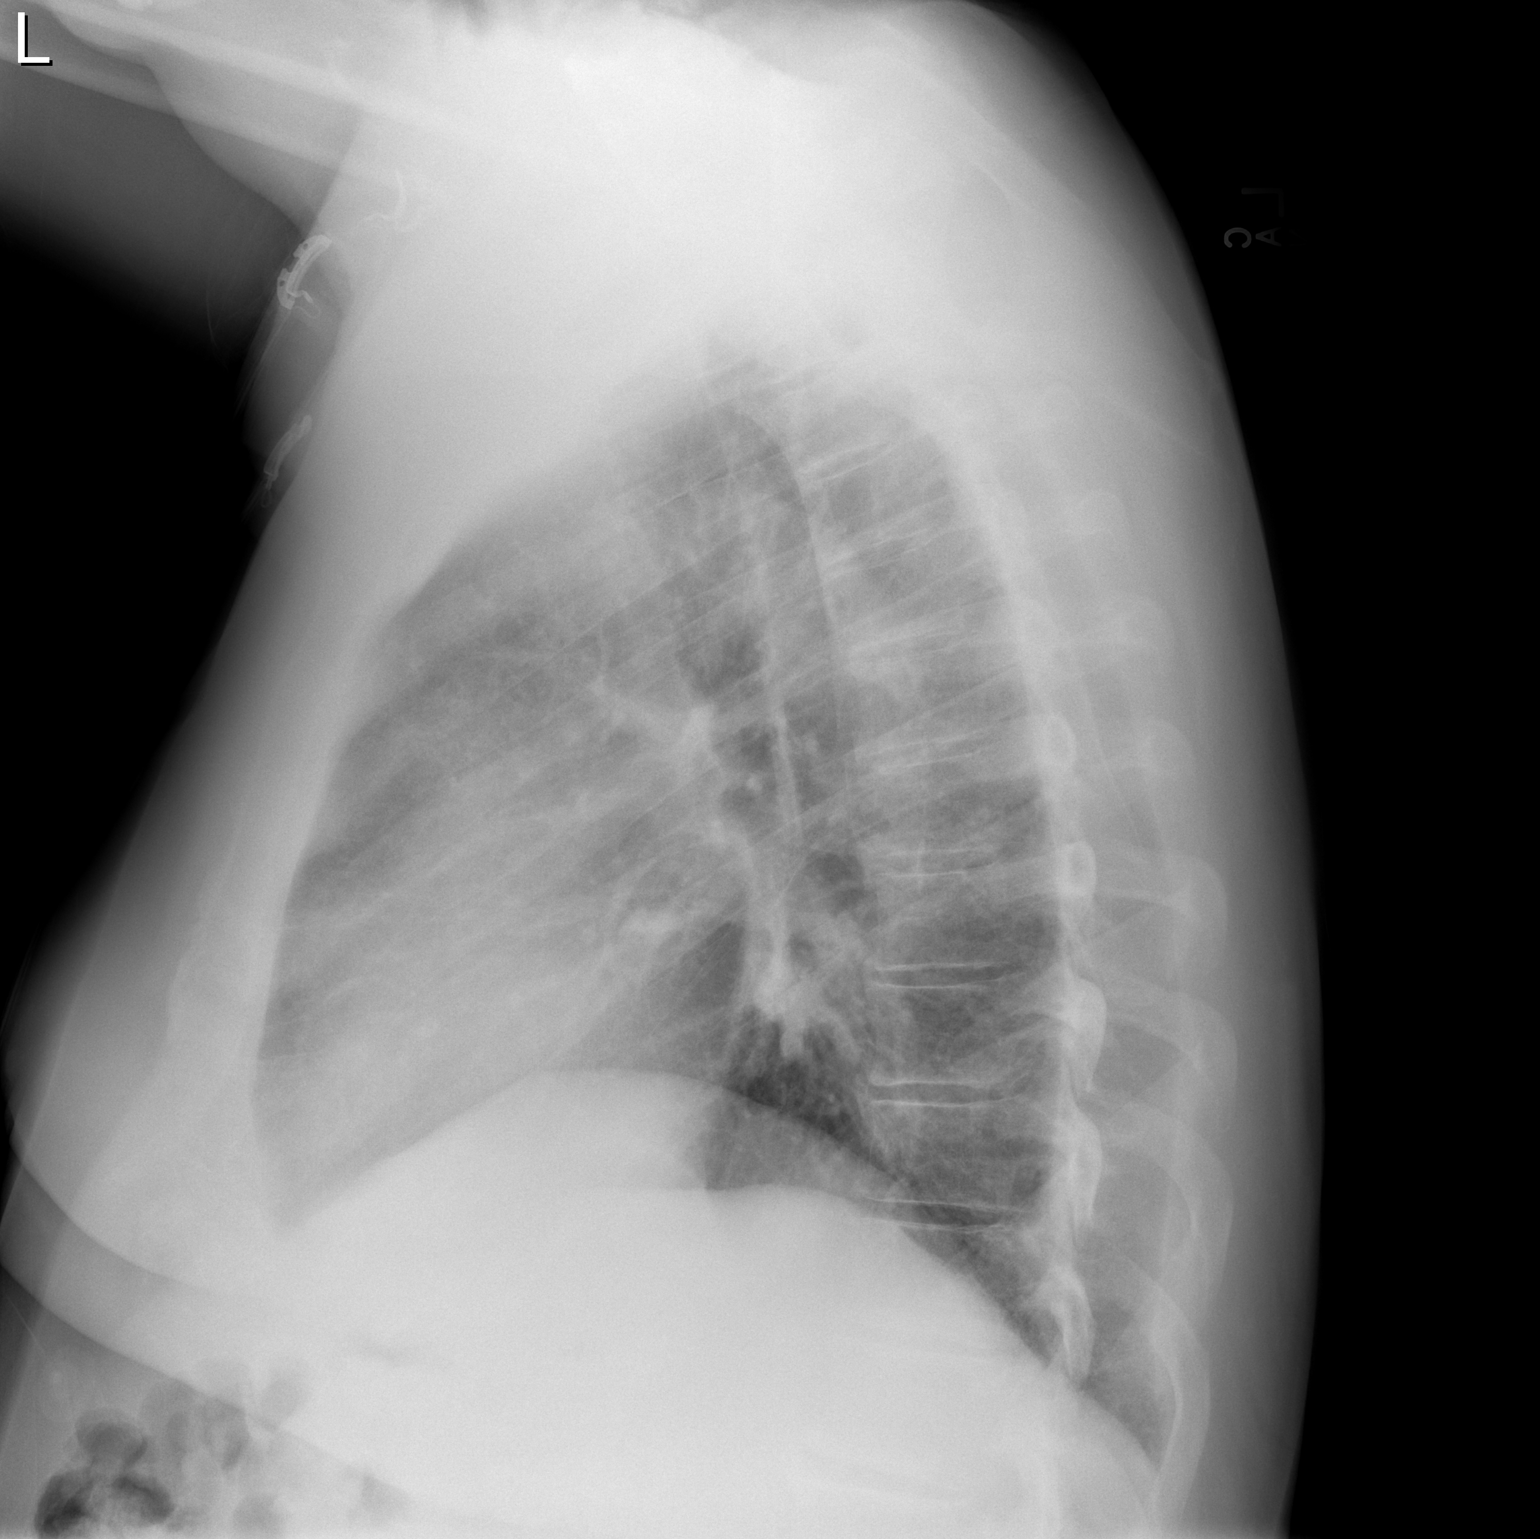

[2 of 2 positions shown; findings below may reference images not displayed]

FINDINGS: Cardiopericardial silhouette within normal limits. Mediastinal
contours normal. Trachea midline. No airspace disease or effusion.
Monitoring leads project over the chest. Lower cervical ACDF.
IMPRESSION: No active cardiopulmonary disease.

## 2016-04-03 DEATH — deceased

## 2016-10-22 ENCOUNTER — Ambulatory Visit (HOSPITAL_COMMUNITY): Admit: 2016-10-22 | Payer: Self-pay
